# Patient Record
Sex: Female | Born: 1990 | Race: Black or African American | Hispanic: No | Marital: Single | Smoking: Former smoker
Health system: Southern US, Community
[De-identification: ages and names within clinical notes are randomized; demographics above are authoritative.]

## PROBLEM LIST (undated history)

## (undated) DIAGNOSIS — A749 Chlamydial infection, unspecified: Secondary | ICD-10-CM

## (undated) DIAGNOSIS — Z8742 Personal history of other diseases of the female genital tract: Secondary | ICD-10-CM

## (undated) DIAGNOSIS — A64 Unspecified sexually transmitted disease: Secondary | ICD-10-CM

## (undated) DIAGNOSIS — G43909 Migraine, unspecified, not intractable, without status migrainosus: Secondary | ICD-10-CM

## (undated) DIAGNOSIS — A549 Gonococcal infection, unspecified: Secondary | ICD-10-CM

## (undated) HISTORY — PX: BREAST ENHANCEMENT SURGERY: SHX7

---
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
## 2010-10-26 LAB — BASIC METABOLIC PANEL
BUN: 6 mg/dL (ref 6–23)
BUN: 6 mg/dL (ref 6–23)
BUN: 7 mg/dL (ref 6–23)
CO2: 27 mEq/L (ref 19–32)
Calcium: 8.1 mg/dL — ABNORMAL LOW (ref 8.4–10.5)
Calcium: 8.7 mg/dL (ref 8.4–10.5)
Calcium: 8.8 mg/dL (ref 8.4–10.5)
Chloride: 103 mEq/L (ref 96–112)
Chloride: 105 mEq/L (ref 96–112)
Chloride: 105 mEq/L (ref 96–112)
Chloride: 106 mEq/L (ref 96–112)
Creatinine, Ser: 0.59 mg/dL (ref 0.4–1.2)
Creatinine, Ser: 0.65 mg/dL (ref 0.4–1.2)
Creatinine, Ser: 0.76 mg/dL (ref 0.4–1.2)
GFR calc Af Amer: >60 mL/min (ref >60)
GFR calc non Af Amer: >60 mL/min (ref >60)
GFR calc non Af Amer: >60 mL/min (ref >60)
GFR calc non Af Amer: >60 mL/min (ref >60)
GFR calc non Af Amer: >60 mL/min (ref >60)
Glucose, Bld: 129 mg/dL — ABNORMAL HIGH (ref 70–99)
Glucose, Bld: 99 mg/dL (ref 70–99)
Potassium: 3.5 mEq/L (ref 3.5–5.1)
Potassium: 3.7 mEq/L (ref 3.5–5.1)
Potassium: 3.7 mEq/L (ref 3.5–5.1)
Sodium: 139 mEq/L (ref 135–145)
Sodium: 139 mEq/L (ref 135–145)
Sodium: 140 mEq/L (ref 135–145)

## 2010-10-26 LAB — BLOOD GAS, ARTERIAL
Acid-Base Excess: 3.9 mmol/L — ABNORMAL HIGH (ref 0.0–2.0)
Bicarbonate: 27.5 mEq/L — ABNORMAL HIGH (ref 20.0–24.0)
Bicarbonate: 27.7 mEq/L — ABNORMAL HIGH (ref 20.0–24.0)
FIO2: 0.21 %
O2 Saturation: 98.7 %
Patient temperature: 98.6
TCO2: 24.7 mmol/L (ref 0–100)
pCO2 arterial: 39.1 mmHg (ref 35.0–45.0)
pCO2 arterial: 39.8 mmHg (ref 35.0–45.0)
pH, Arterial: 7.456 — ABNORMAL HIGH (ref 7.350–7.400)
pO2, Arterial: 113 mmHg — ABNORMAL HIGH (ref 80.0–100.0)

## 2010-10-26 LAB — URINALYSIS, ROUTINE W REFLEX MICROSCOPIC
Bilirubin Urine: NEGATIVE
Glucose, UA: NEGATIVE mg/dL
Hgb urine dipstick: NEGATIVE
Ketones, ur: NEGATIVE mg/dL
Nitrite: NEGATIVE
Nitrite: NEGATIVE
Protein, ur: NEGATIVE mg/dL
Specific Gravity, Urine: 1.012 (ref 1.005–1.030)
Specific Gravity, Urine: 1.021 (ref 1.005–1.030)
Urobilinogen, UA: 0.2 mg/dL (ref 0.0–1.0)
Urobilinogen, UA: 1 mg/dL (ref 0.0–1.0)
pH: 7 (ref 5.0–8.0)
pH: 8.5 — ABNORMAL HIGH (ref 5.0–8.0)

## 2010-10-26 LAB — BLOOD GAS, VENOUS
Acid-Base Excess: 1.3 mmol/L (ref 0.0–2.0)
Bicarbonate: 27.1 mEq/L — ABNORMAL HIGH (ref 20.0–24.0)
Drawn by: 29459
FIO2: 0.21 %
O2 Saturation: 66.1 %
Patient temperature: 98.6
TCO2: 25 mmol/L (ref 0–100)
pCO2, Ven: 51 mmHg — ABNORMAL HIGH (ref 45.0–50.0)
pH, Ven: 7.345 — ABNORMAL HIGH (ref 7.250–7.300)
pO2, Ven: 38.2 mmHg (ref 30.0–45.0)

## 2010-10-26 LAB — COMPREHENSIVE METABOLIC PANEL
ALT: 11 U/L (ref 0–35)
AST: 20 U/L (ref 0–37)
Albumin: 3.8 g/dL (ref 3.5–5.2)
Alkaline Phosphatase: 55 U/L (ref 39–117)
BUN: 12 mg/dL (ref 6–23)
CO2: 26 mEq/L (ref 19–32)
Calcium: 9.3 mg/dL (ref 8.4–10.5)
Chloride: 108 mEq/L (ref 96–112)
Creatinine, Ser: 0.63 mg/dL (ref 0.4–1.2)
GFR calc Af Amer: >60 mL/min (ref >60)
GFR calc non Af Amer: >60 mL/min (ref >60)
Glucose, Bld: 78 mg/dL (ref 70–99)
Potassium: 4 mEq/L (ref 3.5–5.1)
Sodium: 139 mEq/L (ref 135–145)
Total Bilirubin: 0.4 mg/dL (ref 0.3–1.2)
Total Protein: 6.6 g/dL (ref 6.0–8.3)

## 2010-10-26 LAB — SALICYLATE LEVEL
Salicylate Lvl: 15.6 mg/dL (ref 2.8–20.0)
Salicylate Lvl: 27.9 mg/dL — ABNORMAL HIGH (ref 2.8–20.0)
Salicylate Lvl: 6.6 mg/dL (ref 2.8–20.0)

## 2010-10-26 LAB — RAPID URINE DRUG SCREEN, HOSP PERFORMED
Amphetamines: NOT DETECTED
Barbiturates: NOT DETECTED
Benzodiazepines: NOT DETECTED
Cocaine: NOT DETECTED
Opiates: NOT DETECTED
Tetrahydrocannabinol: NOT DETECTED

## 2010-10-26 LAB — URINALYSIS, DIPSTICK ONLY
Bilirubin Urine: NEGATIVE
Bilirubin Urine: NEGATIVE
Glucose, UA: NEGATIVE mg/dL
Glucose, UA: NEGATIVE mg/dL
Hgb urine dipstick: NEGATIVE
Ketones, ur: NEGATIVE mg/dL
Ketones, ur: NEGATIVE mg/dL
Leukocytes, UA: NEGATIVE
Nitrite: NEGATIVE
Protein, ur: NEGATIVE mg/dL
Specific Gravity, Urine: 1.013 (ref 1.005–1.030)
pH: 8 (ref 5.0–8.0)
pH: 8 (ref 5.0–8.0)

## 2010-10-26 LAB — DIFFERENTIAL
Basophils Absolute: 0 10*3/uL (ref 0.0–0.1)
Basophils Relative: 0 % (ref 0–1)
Eosinophils Absolute: 0.3 10*3/uL (ref 0.0–0.7)
Eosinophils Relative: 3 % (ref 0–5)
Lymphocytes Relative: 24 % (ref 12–46)
Lymphs Abs: 2.1 10*3/uL (ref 0.7–4.0)
Monocytes Absolute: 0.7 10*3/uL (ref 0.1–1.0)
Monocytes Relative: 8 % (ref 3–12)
Neutro Abs: 5.7 10*3/uL (ref 1.7–7.7)
Neutrophils Relative %: 64 % (ref 43–77)

## 2010-10-26 LAB — MAGNESIUM: Magnesium: 2 mg/dL (ref 1.5–2.5)

## 2010-10-26 LAB — CBC
HCT: 38 % (ref 36.0–46.0)
Hemoglobin: 13.1 g/dL (ref 12.0–15.0)
MCHC: 34.6 g/dL (ref 30.0–36.0)
MCV: 88.7 fL (ref 78.0–100.0)
Platelets: 234 10*3/uL (ref 150–400)
RBC: 4.28 MIL/uL (ref 3.87–5.11)
RDW: 13.6 % (ref 11.5–15.5)
WBC: 8.9 10*3/uL (ref 4.0–10.5)

## 2010-10-26 LAB — PROTIME-INR
INR: 1.1 (ref 0.00–1.49)
Prothrombin Time: 15 s (ref 11.6–15.2)

## 2010-10-26 LAB — URINE MICROSCOPIC-ADD ON

## 2010-10-26 LAB — ETHANOL: Alcohol, Ethyl (B): <5 mg/dL (ref 0–10)

## 2010-10-26 LAB — PREGNANCY, URINE: Preg Test, Ur: NEGATIVE

## 2010-10-26 LAB — ACETAMINOPHEN LEVEL: Acetaminophen (Tylenol), Serum: <10 ug/mL — ABNORMAL LOW (ref 10–30)

## 2010-10-26 LAB — TSH: TSH: 1.214 u[IU]/mL (ref 0.350–4.500)

## 2010-11-30 NOTE — H&P (Signed)
Kristen Hensley, Kristen Hensley                 ACCOUNT NO.:  [id number]   MEDICAL RECORD NO.:  [id number]          PATIENT TYPE:  EMS   LOCATION:  ED                           FACILITY:  [facility]   PHYSICIAN:  Pedro Earls, MD     DATE OF BIRTH:  1990/10/23   DATE OF ADMISSION:  11/22/2008  DATE OF DISCHARGE:                              HISTORY & PHYSICAL   PRIMARY CARE PHYSICIAN:  Unassigned.   CHIEF COMPLAINT:  Aspirin overdose.   HISTORY OF PRESENT ILLNESS:  This is an 20 year old African-American  female patient with no significant past medical history was brought in  after an aspirin overdose. According to the patient, she had some  argument with her boyfriend today and after that, she took a handful of  aspirin, which she thought was definitely less than 50. The patient  subsequently had called her family members to notify that she had taken  some aspirin and had started feeling shortness of breath and was having  difficulty breathing.  The patient was brought in to the ER for further  evaluation and management.   REVIEW OF SYSTEMS:  As above.  Rest of the review of systems negative.   PAST MEDICAL HISTORY:  None.   PAST SURGICAL HISTORY:  None.   MEDICATIONS:  None.   SOCIAL HISTORY:  Negative smoking, alcohol, IV drug abuse.   FAMILY HISTORY:  Negative for diabetes, heart disease.   ALLERGIES:  NO KNOWN DRUG ALLERGIES.   PHYSICAL EXAMINATION:  VITAL SIGNS:  Temperature 97.6, respirations 18,  pulse 90's to 100's, blood pressure 80s to 106 over 50s to 70s, pulse ox  96% on 2 liters nasal cannula.  GENERAL:  Patient awake, alert, oriented x3.  Does not appear to be in  distress.  HEENT:  Pupils equal, round. No pallor. Extraocular muscles intact.  Mucous membranes are dry.  NECK:  Supple. No JVD. No lymphadenopathy.  CARDIOVASCULAR:  S1 and S2 regular. There is sinus tachycardia.  CHEST:  Clear.  ABDOMEN:  Soft. Non-tender. Bowel sounds present. No hepatosplenomegaly.  EXTREMITIES:  No clubbing, cyanosis, edema.  NEUROLOGIC:  Sensory grossly intact. Cranial nerves 2-12 are intact.  SKIN:  No rashes.  MUSCULOSKELETAL:  Unremarkable.   LABORATORY:  Initial salicylate levels were 15.6, repeated one was 27.9,  magnesium was 2.0, alcohol was less than 5, Tylenol level was less than  10, UA was negative. Urine pregnancy test was negative. Basic metabolic  panel unremarkable. INR 1.1. CBC unremarkable. Venous gases were done  with a pH of 7.34, pCO2 of 51, pO2 38, bicarb 27.1.   IMPRESSION:  1. Salicylate poisoning.  2. Dehydration.  3. Hypotension.  4. Sinus tachycardia.   PLAN:  Admit to ICU.  Continue D5 half normal with potassium and add  bicarb at 150 an hour. BMP q.4 h, aspirin levels q.4 h. Check ABG now  and in the a.m.Pedro Earls, MD  Electronically Signed     NS/MEDQ  D:  11/22/2008  T:  11/22/2008  Job:  [id number]

## 2010-11-30 NOTE — Discharge Summary (Signed)
Kristen Hensley, Kristen Hensley                 ACCOUNT NO.:  [id number]   MEDICAL RECORD NO.:  [id number]          PATIENT TYPE:  INP   LOCATION:  1520                         FACILITY:  [facility]   PHYSICIAN:  Isidor Holts, M.D.  DATE OF BIRTH:  04/27/1991   DATE OF ADMISSION:  11/22/2008  DATE OF DISCHARGE:  11/24/2008                               DISCHARGE SUMMARY   PRIMARY CARE PHYSICIAN:  Unassigned.   DISCHARGE DIAGNOSES:  1. Aspirin overdose.  2. Dehydration/volume depletion, secondary to #1.  3. Hypotension, secondary to #1 and #2 above.  4. Depression.   DISCHARGE MEDICATIONS:  None.   PROCEDURE:  None.   CONSULTATIONS:  Dr. Antonietta Breach, psychiatry.   ADMISSION HISTORY:  As in H&P notes of Nov 22, 2008, dictated by Dr.  Pedro Earls.  However, in brief this is an 20 year old female, with  no significant past medical or surgical history, who was brought to the  emergency department following ingestion of a handful of Aspirin  tablets, after an argument with her friend.  Reportedly, she started  feeling short of breath, and called her family to notify that she had  ingested several Aspirins.  On initial evaluation in the emergency  department she was found to have a salicylate level of 15.6 which was  then repeated and was found to be 27.9.  She was admitted to the  intensive care unit for further evaluation and management.   CLINICAL COURSE:  1. Salicylate overdose.  Specific amount of salicylate ingested, was      unclear.  However, this was a handful and probably close to 50      tablets.  The patient's acetaminophen levels were less than 10.      Alcohol level less than 5. There was no history of associated other      substance ingestion.  ABGs were done, which showed the following      findings on room air.  PH 7.456, pCO2 39.8, pO2 94.7, bicarbonate      27.7 on venous blood.  Urine drug screen was negative, as was      pregnancy test.  [facility]  was contacted and      they were very helpful in directing appropriate management.  The      patient was placed on intravenous infusion of bicarbonate, and      proton pump inhibitor administered.  Urine output was monitored and      serial salicylate levels, urine pH and serum electrolytes were      done, over the course of her hospitalization.  Salicylate level      reached an all-time high of 36.5 and then gradually drifted down,      normalizing at less than 4 on Nov 24, 2008.  The patient's clinical      condition remained stable.  She experienced no abdominal pain,      vomiting or hematemesis and no metabolic acidosis was documented.      She did appear dehydrated at the time of presentation and had a  blood pressure initially as low as 86/43, but this responded to      intravenous fluid hydration with appropriate boluses.  By Nov 23, 2008 in the p.m., the poison center called and after careful review      of the patient's biochemistries, cleared the patient from the      viewpoint of toxicology.  The patient was then transferred out of      intensive care unit, remained stable and we were able to      discontinue intravenous fluids on Nov 24, 2008 a.m. without any      deleterious consequences.   1. Depression.  The patient clinically appeared somewhat depressed,      following her overdose.  Psychiatric consultation has been      requested and was kindly provided by Dr. Antonietta Breach. For      details of the consultation, refer to consultation notes of Nov 24, 2008.  He has opined that major depressive disorder ought to be      considered, and has recommended transfer to inpatient psychiatric      unit for further evaluation and treatment.   DISPOSITION:  The patient was on Nov 24, 2008, considered medically  stable, drug overdose had resolved, and there were no further issues.  She was therefore medically cleared for discharge to [facility]  unit for  continued inpatient psychiatric care.   DIET:  No restrictions.   ACTIVITY:  Activity as tolerated.   FOLLOW-UP INSTRUCTIONS:  This will be determined, following discharge  from the [facility].      Isidor Holts, M.D.  Electronically Signed     CO/MEDQ  D:  11/24/2008  T:  11/24/2008  Job:  [id number]

## 2010-11-30 NOTE — H&P (Signed)
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 Jeanie Sewer for inpatient adolescent  psychiatric treatment of suicide attempt, depression and dangerous  disruptive behavior with partial amnesia.  The patient informed Dr.  Jeanie Sewer of a 2 week course of depression preceding her overdose with  aspirin informing him that it was definitely a suicide attempt.  She had  given different accounts to the emergency department, medical service  and mother.  Mother is angry that the patient is required to have mental  health treatment for her overdose which could have been lethal requiring  EMS transport and critical care treatment.  The patient and mother  subsequently conclude that the patient just accidentally took extra  aspirin when she had a headache without expecting such untoward  reaction.   HISTORY OF PRESENT ILLNESS:  The patient states mother was in  [place] visiting others when she called mother that she was having  difficulty breathing and her mind was coming in and out after taking  aspirin.  Mother contacted a friend who went to the patient's residence  to help the patient try to vomit as recommended by mother.  The patient  was apparently having chest pain, dyspnea, her memory and attention were  coming and going.  In the emergency department, assessment attempted to  coordinate the understanding of the family, the patient and  medical  tests to clarify the risks to the patient.  The patient had reportedly  ingested aspirin from a bottle of 200 count that the family concluded  could have had no more than 158 tablets left at the time of the  overdose.  The patient was estimated to have taken between 50-150  tablets with the patient just describing it as aspirin poured into her  hand.  The patient had taken the pills apparently after attempting to  deal with the best female friend who was threatening to drop out of  school.  The patient suggests she had gone to exercise at the gym in  preparing for a trip to the beach after school is over.  The patient did  not describe other associations with headache, other overdose or  psychological symptoms that would definitely clarify the extent of the  patient's decompensation.  The patient informed Dr. Jeanie Sewer that she  had 2 weeks of anhedonia, dysphoria, diminished sleep, withdrawing from  mother staying in bed in the morning, and diminished energy.  Dr.  Jeanie Sewer interpreted the depression possibly associated with medical  condition or possibly major depression needing inpatient mental health  treatment.  The patient had informed Dr. Jeanie Sewer  that she was  definitely attempting suicide at the time.  The patient later  acknowledged that she had some patchy memory loss and poor attention  span, but that she and mother had reformulated that the patient had  taken the aspirin accidentally possibly taking some and then more  without remembering she had already taken some.  The patient's mother  then formulated that the patient has had no depression and that she  handles stress well.  They report that she takes care of what she can  take care of and then moves on.  The patient is living between the  households of mother and father having 2 younger siblings.  She will be  leaving the family for college either at [employer] or [employer].  She stays  primarily with father, but does see mother on the  weekends.  The patient is on no medications other than having had  Humibid, Protonix, and Senna in the Critical Care Unit.  The patient is  not certain of the full extent of what has happened to her, but mother  can thank God that the patient is alive and the patient can state that  she wants to live now.  The patient does not acknowledge manic or  psychotic symptoms.  However, the patient still cannot fully assimilate  cause and affect for taking the pills, the medical and psychological  consequences, and how to resolve the risk factors for more serious  injury.   PAST MEDICAL HISTORY:  The patient has apparently attended the emergency  department for 4 visits between May 2000 and August 2009 other than the  emergency for the overdose immediately preceding her [facility]  hospitalization.  The patient has had mittelschmerz, earache, and flu  symptoms in the past.  She has a thin habitus, but denies other eating  disorder symptoms.  She is not requiring Senna, Protonix or Humibid  after release from the Critical Care Unit where she was treated with  intravenous hydration and close monitoring.  She has eyeglasses for  reading.  She has no medication allergies.  She has no other regular  medications.  She reports that menses are normal and she has no concern  about pregnancy, though she does not discuss her sexual activity.  Her  urine pregnancy test had been negative in the emergency department as  had her urine drug screen and she does not acknowledge any substance  use.  Her aspirin level had reached 36.5 maximum as she was being  treated with reference range 2.8-20.  She has had no seizure or syncope.  She has had no heart murmur or arrhythmia.  She has had no purging.  Potassium reached a low 3.4 in the course of her medical treatment with  the patient describing spontaneous hyperventilation, patchy memory and  attention span.    REVIEW OF SYSTEMS:  The patient denies difficulty with gait, gaze or  continence subsequently.  She denies rash, jaundice or purpura.  She has  no known exposure to communicable disease or toxins.  She has no cough,  congestion, dyspnea or wheeze currently.  She has no chest pain,  palpitations or presyncope subsequently.  She has no abdominal pain,  nausea, vomiting or diarrhea.  There is no dysuria or arthralgia.   IMMUNIZATIONS:  Up to date.   FAMILY HISTORY:  The patient resides with father primarily having 2  younger siblings, a brother and a sister all of whom go between the 2  households.  They primarily stay with mother on some weekends.  Some  family member mentions a distant relative with intravenous drug abuse.  Mother does not want the patient to get help because of stigma.  There  is a family history of diabetes, hypertension and cancer according to  the patient.  Parents are divorced.   SOCIAL/DEVELOPMENTAL HISTORY:  The patient is a Holiday representative at [employer].  She indicates she will graduate and is deciding between [address] and [employer] after high school.  She denies any  legal charges.  She does not definitely acknowledge any sexual activity.  She denies use of alcohol or illicit drugs.   ASSETS:  The patient seems social.   MENTAL STATUS EXAM:  Height is 157 cm and weight is 47 kg having been  47.7 kg in the ED January 27, 2006.  Blood pressure is 94/67 with heart  rate of 111.  She is right handed.  She is alert and oriented with  speech intact.  Cranial nerves II-XII are intact.  Muscle strengths and  tone are normal.  There are no pathologic reflexes or soft neurologic  findings except the patient still has some inability to define or  resolve the patchy memory and attention span surrounding her call for  help and the hospital course of treatment where she has given various  conflicting histories.  She has no abnormal involuntary movements.   Gait  and gaze are intact.  The patient is gradually clearing for restoration  of social and emotional problem solving.  She attempts to rework her  overdose now preferring to account for such as accidental or  irresponsible management of the headache rather than suicide attempt for  depression or acute psychosocial stressors.  The patient has hysteroid  denial and displacement features.  She appears to have some peer  relational conflictual concerns, individuation stressors and divorced  family stressors of which she and mother subsequently have denial of any  significance.  She has no suicidal ideation now, but has not resolved  her serious overdose.  She has no mania or psychosis.  She has no  anxiety or definite ongoing dissociation.  Organicity would likely have  been in the form of delirium associated with substance intoxication of  aspirin now resolving.  She is not homicidal or assaultive   IMPRESSION:  AXIS I:  1. Depressive disorder not otherwise specified with atypical features.  2. Aspirin intoxication delirium, now remitting.  3. Relational problem, not otherwise specified.  4. Parent/child relational problem.  5. Phase of life problem.  AXIS II:  No diagnosis.  AXIS III:  1. Aspirin overdose.  2. Eyeglasses for reading.  3. Relative small and thin stature.  AXIS IV:  Stressors, phase of life moderate acute and chronic; peer  relations moderate acute and chronic; family moderate acute and chronic.  AXIS V:  Global assessment of functioning on admission 30 with highest  in the last year estimated at 84.   PLAN:  The patient is admitted for inpatient adolescent psychiatric and  multidisciplinary multimodal behavioral health treatment in a team-based  programmatic locked psychiatric unit.  We will resolve any delirium and  atypical depression for return to outpatient management of such  conflicts and stressors.  Cognitive behavioral therapy, anger  management,  interpersonal therapy, social and communication skill  training, problem-solving and coping skill training, family  intervention, individuation separation and identity consolidation  therapies can be undertaken.  Estimated length stay  is 3 days with  target symptom for discharge being stabilization of any suicide risk and  mood, stabilization of memory and attention for safe and effective  behavioral restoration, and generalization of the capacity for safe  effective participation in outpatient treatment.      Lalla Brothers, MD  Electronically Signed     GEJ/MEDQ  D:  11/26/2008  T:  11/26/2008  Job:  [id number]

## 2010-11-30 NOTE — Consult Note (Signed)
Kristen Hensley                 ACCOUNT NO.:  [id number]   MEDICAL RECORD NO.:  [id number]          PATIENT TYPE:  INP   LOCATION:  1520                         FACILITY:  [facility]   PHYSICIAN:  Antonietta Breach, M.D.  DATE OF BIRTH:  05-15-1991   DATE OF CONSULTATION:  11/24/2008  DATE OF DISCHARGE:                                 CONSULTATION   REQUESTING PHYSICIAN:  Isidor Holts of [facility].   REASON FOR CONSULTATION:  Suicide attempt with overdose.   HISTORY OF PRESENT ILLNESS:  [name] is an 20 year old female  admitted to the [facility] on Nov 22, 2008 after an intentional  aspirin overdose.  She took an handful of aspirin.  She states that she  did it in a suicide attempt.   She does describe at least 2 weeks of depressed mood, poor energy, not  wanting to get out of bed, anhedonia, insomnia and she states that this  clearly was a suicide attempt.   She is not having any hallucinations or delusions.  She is oriented to  all spheres.  Her memory function is intact.   Precipitating stress involves a riff with a very close friend.   However, the disagreement with her close friend occurred late in the 2-  week period of depression symptoms.   PAST PSYCHIATRIC HISTORY:  No prior history of major depression.  No  suicide attempts in the past.   No history of elevated energy, decreased need for sleep or elevated  mood.   No history of eating disorder symptoms or panic attacks.   No history of mental treatment.   FAMILY PSYCHIATRIC HISTORY:  None known.   SOCIAL HISTORY:  Parents divorced.  The patient lives with her father.  The patient denies alcohol or illegal drugs.  [name] has no  children.  She is a Holiday representative at [employer].   PAST MEDICAL HISTORY:  Status post aspirin overdose.   MEDICATIONS:  MAR reviewed.  She is not currently on any psychotropic  medication.   ALLERGIES:  She has NO KNOWN DRUG ALLERGIES.   LABORATORY DATA:  Aspirin is currently negative.  Sodium 136, BUN 7,  creatinine 0.76, glucose 99.  HCG on the 8th was negative.  Urine drug  screen on the 8th was negative. Alcohol on the 8th was negative.  Tylenol on the 8th was negative.   REVIEW OF SYSTEMS:  Constitutional:  head, eyes, ears, nose and throat,  mouth, neurologic, psychiatric, cardiovascular, respiratory,  gastrointestinal, genitourinary, skin, musculoskeletal, hematologic,  lymphatic, endocrine and metabolic all unremarkable.   PHYSICAL EXAMINATION:  VITAL SIGNS:  Temperature 98.4, pulse 66,  respiratory rate 16, blood pressure 92/60.  GENERAL APPEARANCE:  [name] is a young female appearing her  chronologic age lying in a left lateral decubitus position in her  hospital bed with no abnormal involuntary movements.   MENTAL STATUS EXAM:  [name] is alert.  Her eye contact is good.  Her  attention span is slightly decreased. Concentration mildly decreased.  Affect is constricted.  Mood is depressed.  She  is oriented to all  spheres.  Her memory is intact to immediate, recent and remote.  Fund of  knowledge and intelligence are normal.  Speech involves normal rate and  prosody without dysarthria.  The volume of speech is decreased.  Thought  process is logical, coherent, goal-directed.  No looseness of  associations.  Thought content: She does acknowledge suicidal intent.  Insight is partial for her depression.  Judgment is impaired.   ASSESSMENT:  AXIS I:  293.83 mood disorder not otherwise specified,  depressed, rule out major depressive disorder.  AXIS II:  Deferred.  AXIS III:  See past medical history.  AXIS IV:  Primary support group.  AXIS V:  30.   [name] is at risk for suicide.  She does have symptoms of major  depression which preceded her social precipitating stress.   RECOMMENDATIONS:  1. Would admit to an inpatient psychiatric unit for further evaluation      and treatment.  2. Would  continue ego supportive psychotherapy. The undersigned      provided ego supportive psychotherapy and education.  3. For anti-insomnia would utilize Vistaril 25-50 mg p.o. q.h.s.      p.r.n. insomnia with caution regarding dry mouth and excessive      sedation.   Other psychotropic medication deferred.      Antonietta Breach, M.D.  Electronically Signed     JW/MEDQ  D:  11/24/2008  T:  11/24/2008  Job:  [id number]

## 2010-12-03 NOTE — Discharge Summary (Signed)
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  Patient had given variable descriptions of the reason and  mechanism for her aspirin overdose from the time of her arrival through  the course of medical stabilization and psychiatric consultation by Dr.  Jeanie Sewer.  The family was opposed to the patient receiving further  mental health care fearing stigma.  The patient was more capable of  accomplishing such but still recovered limited specific memory to  clarify differing providers documented, differing reports from the  patient about reason for overdose and how significant and extensive the  overdose might be.  For full details, please see the typed admission  assessment.   SYNOPSIS OF PRESENT ILLNESS:  The patient had ingested some portion of a  200 count bottle of aspirin while on the phone with a female friend with  whom she was having significant emotional and behavioral difficulty.  Estimates were that the patient ingested between 50 and 150 tablets,  contacting mother by phone who was in [place] who sent a friend of  hers to the home to help the patient.  The patient apparently could not  vomit by ingesting milk, and an ambulance was called as the patient  began to have waxing and waning attention and level  of consciousness as  well as chest pain and dyspnea.  Patient suggests that she had returned  from the gym where she was working out to get ready for [employer].  She  gave various accounts of having been depressed for the preceding 2 weeks  and overdosing to commit suicide as opposed to accidentally taking the  pills without counting them as she was treating a headache while in an  argument with friend on the phone who wanted to quit school.  The  patient did receive medical stabilization and was transferred for  psychiatric care.  Patient acknowledges that the family has issues with  parents divorced and the children living between both houses,  predominantly with father during the week and mother on the weekends.  The patient seems concerned but will not discuss her concern for the 2  younger siblings who will continue this pattern after the patient leaves  for college at [employer] or [employer] next fall.  There is family history  of diabetes, hypertension and cancer.   INITIAL MENTAL STATUS EXAMINATION:  The patient is right-handed with  intact neurological exam other than the patchy memory for  the various  statements she had made about her overdose.  Patient would smile but  would not complete the appropriate provision of history without full  social recognition of her lapses even though she is intelligent and  makes good grades.  Patient was gradually clearing in order to face a  goal of social and emotional problem solving and restoration.  She  varied between needing to account for suicidal or accidental overdose.  She had no dissociation or post-traumatic flashbacks.  She had no other  significant anxiety.  Her dysphoria was episodic, possibly present up to  2 weeks but otherwise possibly present only a couple of days.   LABORATORY FINDINGS:  In [facility], CBC was normal with white  count 8900, hemoglobin 13.1, MCV of 88.7 and platelet count 234,000.  Her pro time was  normal at 15 and INR at 1.1.  Venous pH was elevated at  7.345 with upper limit of normal 7.3.  Comprehensive metabolic panel was  normal with sodium 139, potassium 4, random glucose 78, creatinine 0.63,  calcium 9.3, albumin 3.8, AST 20 and ALT 11.  Blood alcohol was  negative, and urine drug screen was negative.  Acetaminophen level was  negative.  Initial aspirin level was 15.6 rising to 27.9 reaching a peak  of 36.5 with therapeutic range being 2.8 to 20 mg/dL.  Subsequently, the  aspirin level began to decline to 23.7, 16.1, 6.6 and finally less than  4.  Urine pregnancy test was negative.  Urinalysis was normal with  specific gravity of 1.021 and pH 7.  Magnesium was normal at 2 initially  and when repeated the following day at 1.9 with reference range of 1.5  to 2.5.  Urine dipstick documented that urine was alkalinized to at  least a pH of 8 and arterial blood gas to pH 7.461.  Electrolytes  remained otherwise intact throughout the medical treatment course at  [facility].  After transfer, there was an RPR that was nonreactive,  free T4 of 0.98 and TSH at 1.214, all normal.   HOSPITAL COURSE AND TREATMENT:  General medical exam by Jorje Guild, PA-C,  noted no medication allergies.  Patient is somewhat small in stature and  thin.  She had menarche at age 58 with regular menses, last being end of  April 2010.  Her BMI was 19.1.  She does have eyeglasses and  acknowledges being sexually active.  She was educated at length on  health maintenance and prevention.  Her overdose consequences were  resolved, and she was medically cleared for psychiatric treatment.  Her  vital signs were normal throughout hospital stay with temperature  maximum 98.1.  Her height was 157 cm, and weight was 47.2 kg initially,  dropping to 47 kg.  Initial supine blood pressure was 105/60 with heart  rate of 76 and standing blood pressure 98/65 with heart rate of 117 on  admission.  At the time of discharge,  supine blood pressure was 93/52  with heart rate of 65 and standing blood pressure 91/57 with heart rate  of 84.  Patient became progressively capable of participating verbally  in the treatment program and process.  However, at the same time, she  seemed to consolidate the family perspective that she had accidentally  overdosed trying to treat a headache and that all of her stressors are  resolved.  Patient acknowledged that she came close to dying but did  address coping skills and ways to prevent such in  the future.  Patient  did improve her verbal communication for problem solving and her  clarification of issues to address with family.  The family was not  interested in treatment.  In the discharge family session, parents were  only concerned that being in the hospital might affect the patient's  future plans of wanting to be a pharmacist, and the parents had no  concern that the patient could have died noting that she really did not  want to die and that she took the pills by mistake in their opinion.  Patient indicated she can be immature and had learned things about  herself that she can work on including communicating more effectively.  She indicated she could start expressing her feelings to her parents  instead of storing up such negative emotions.  She concluded she just  was not paying attention.  In the end, it appeared that patient had  become partially delirious in the course of her aspirin intoxication and  that she resolved all of the confusing statements about being depressed  and attempting suicide by the time of her complete clearing of the  aspirin overdose.  She required no seclusion or restraint during the  hospital stay and received no other medications.   FINAL DIAGNOSES:  AXIS I:  1.  Aspirin intoxication delirium--remitted.  1. Phase of life problem.  2. Parent-child relational problem.  3. Relational problem, not otherwise specified.  AXIS II:  No  diagnosis.  AXIS III:  1.  Aspirin overdose.  1. Eyeglasses for reading.  2. Relative small and thin stature.  AXIS IV:  Stressors:  Phase of life moderate, acute and chronic; peer  relations moderate, acute and chronic; family moderate, acute and  chronic.  AXIS V:  GAF on admission 30 with highest in the last year 34, and  discharge GAF was 53.   PLAN:  Patient was discharged to mother in improved condition, free of  suicide ideation and depression.  She follows a regular diet and has no  restrictions on physical activity.  She requires no pain management or  wound care.  Crisis and safety plans are outlined if needed.  Patient is  on no medications at the time of discharge.  She was seen  by Lennox Pippins, PhD, during the hospital stay and has psychiatric followup  with Dr. Lang Snow at [facility] on December 18, 2008, at 1500 hours at [phone].  Patient has outpatient psychotherapy intake at [facility] on Nov 30, 2008, at 1830 hours at [phone].      Lalla Brothers, MD  Electronically Signed     GEJ/MEDQ  D:  12/02/2008  T:  12/02/2008  Job:  [id number]   cc:   [facility]   Fax:  [phone] [facility]

## 2012-10-28 ENCOUNTER — Encounter (HOSPITAL_COMMUNITY): Payer: Self-pay | Admitting: *Deleted

## 2012-10-28 ENCOUNTER — Emergency Department (HOSPITAL_COMMUNITY)
Admission: EM | Admit: 2012-10-28 | Discharge: 2012-10-28 | Disposition: A | Discharge status: Home / Self Care | Payer: Self-pay | Attending: Emergency Medicine | Admitting: Emergency Medicine

## 2012-10-28 DIAGNOSIS — Z79899 Other long term (current) drug therapy: Secondary | ICD-10-CM | POA: Insufficient documentation

## 2012-10-28 DIAGNOSIS — J302 Other seasonal allergic rhinitis: Secondary | ICD-10-CM

## 2012-10-28 DIAGNOSIS — J309 Allergic rhinitis, unspecified: Secondary | ICD-10-CM | POA: Insufficient documentation

## 2012-10-28 MED ORDER — TRIAMCINOLONE ACETONIDE(NASAL) 55 MCG/ACT NA INHA: 2.0000 | Freq: Every day | NASAL | Status: DC

## 2012-10-28 NOTE — ED Notes (Signed)
Per pt report: pt c/o a really bad cough, dizziness, and periodic eye swelling.  Pt also c/o of generalized body aches.  Pt took benadryl and sudafed with no relief.

## 2012-10-28 NOTE — ED Provider Notes (Signed)
History     CSN: [id number]  Arrival date & time 10/28/12  7829   First MD Initiated Contact with Patient 10/28/12 0703      No chief complaint on file.   (Consider location/radiation/quality/duration/timing/severity/associated sxs/prior treatment) The history is provided by the patient.   patient here complaining of 24-hour history of thigh swelling and also nasal congestion consistent with her prior seasonal allergies. No fever or chills. Nonproductive cough noted. Has used Benadryl and Sudafed without relief. Denies any neck pain or photophobia. Symptoms have been persistent seemed to be worse with walking outside.  History reviewed. No pertinent past medical history.  History reviewed. No pertinent past surgical history.  No family history on file.  History  Substance Use Topics  . Smoking status: Never Smoker   . Smokeless tobacco: Not on file  . Alcohol Use: 1.0 oz/week    2 drink(s) per week    OB History   Grav Para Term Preterm Abortions TAB SAB Ect Mult Living                  Review of Systems  All other systems reviewed and are negative.    Allergies  Review of patient's allergies indicates no known allergies.  Home Medications   Current Outpatient Rx  Name  Route  Sig  Dispense  Refill  . diphenhydrAMINE (BENADRYL) 25 mg capsule   Oral   Take 25 mg by mouth every 6 (six) hours as needed for itching.         Marland Kitchen guaiFENesin (MUCINEX) 600 MG 12 hr tablet   Oral   Take 600 mg by mouth 2 (two) times daily.           BP 104/66  Pulse 99  Temp(Src) 98.2 F (36.8 C) (Oral)  Resp 18  Ht 5\' 2"  (1.575 m)  Wt 110 lb (49.896 kg)  BMI 20.11 kg/m2  SpO2 98%  LMP 09/27/2012  Physical Exam  Nursing note and vitals reviewed. Constitutional: She is oriented to person, place, and time. She appears well-developed and well-nourished.  Non-toxic appearance. No distress.  HENT:  Head: Normocephalic and atraumatic.  Pain to palpation  At patients  maxillary sinuses  Eyes: Conjunctivae, EOM and lids are normal. Pupils are equal, round, and reactive to light.  Neck: Normal range of motion. Neck supple. No tracheal deviation present. No mass present.  Cardiovascular: Normal rate, regular rhythm and normal heart sounds.  Exam reveals no gallop.   No murmur heard. Pulmonary/Chest: Effort normal and breath sounds normal. No stridor. No respiratory distress. She has no decreased breath sounds. She has no wheezes. She has no rhonchi. She has no rales.  Abdominal: Soft. Normal appearance and bowel sounds are normal. She exhibits no distension. There is no tenderness. There is no rebound and no CVA tenderness.  Musculoskeletal: Normal range of motion. She exhibits no edema and no tenderness.  Neurological: She is alert and oriented to person, place, and time. She has normal strength. No cranial nerve deficit or sensory deficit. GCS eye subscore is 4. GCS verbal subscore is 5. GCS motor subscore is 6.  Skin: Skin is warm and dry. No abrasion and no rash noted.  Psychiatric: She has a normal mood and affect. Her speech is normal and behavior is normal.    ED Course  Procedures (including critical care time)  Labs Reviewed - No data to display No results found.   No diagnosis found.    MDM  Patient to  be treated for seasonal allergies        Toy Baker, MD 10/28/12 [phone]

## 2013-03-20 ENCOUNTER — Encounter (HOSPITAL_COMMUNITY): Payer: Self-pay | Admitting: Emergency Medicine

## 2013-03-20 ENCOUNTER — Emergency Department (HOSPITAL_COMMUNITY)
Admission: EM | Admit: 2013-03-20 | Discharge: 2013-03-20 | Disposition: A | Discharge status: Home / Self Care | Payer: Self-pay | Attending: Emergency Medicine | Admitting: Emergency Medicine

## 2013-03-20 DIAGNOSIS — N760 Acute vaginitis: Secondary | ICD-10-CM | POA: Insufficient documentation

## 2013-03-20 DIAGNOSIS — B9689 Other specified bacterial agents as the cause of diseases classified elsewhere: Secondary | ICD-10-CM

## 2013-03-20 DIAGNOSIS — R21 Rash and other nonspecific skin eruption: Secondary | ICD-10-CM | POA: Insufficient documentation

## 2013-03-20 DIAGNOSIS — F17200 Nicotine dependence, unspecified, uncomplicated: Secondary | ICD-10-CM | POA: Insufficient documentation

## 2013-03-20 LAB — WET PREP, GENITAL: Yeast Wet Prep HPF POC: NONE SEEN

## 2013-03-20 MED ORDER — CLOTRIMAZOLE 1 % EX CREA: TOPICAL_CREAM | CUTANEOUS | Status: DC

## 2013-03-20 MED ORDER — METRONIDAZOLE 500 MG PO TABS: 500.0000 mg | ORAL_TABLET | Freq: Two times a day (BID) | ORAL | Status: DC

## 2013-03-20 NOTE — ED Provider Notes (Signed)
Medical screening examination/treatment/procedure(s) were performed by non-physician practitioner and as supervising physician I was immediately available for consultation/collaboration.  Toy Baker, MD 03/20/13 [phone]

## 2013-03-20 NOTE — ED Provider Notes (Signed)
CSN: [id number]     Arrival date & time 03/20/13  1424 History   First MD Initiated Contact with Patient 03/20/13 1500     Chief Complaint  Patient presents with  . Vaginal Pain   (Consider location/radiation/quality/duration/timing/severity/associated sxs/prior Treatment) HPI Comments: 22 year old healthy female presents to the emergency department complaining of vaginal pain x3 days. Patient states 3 days ago she noticed a small "hair bumps" around her vagina, progressively worsened and became gray with some pus. States it feels irritated, 3/10, has not tried any alleviating factors. Also admits to vaginal discharge for the past 3 days similar to her prior yeast infection. Patient states she's been on Depo-provera for the past 2 years, and every time she has vaginal spotting from her menstrual cycles she developed a yeast infection. Denies increased urinary frequency, urgency or dysuria, abdominal pain, nausea or vomiting, fever or chills. States she has not had sexual intercourse in the past 4 months. Denies history of STDs.   Patient is a 22 y.o. female presenting with vaginal pain. The history is provided by the patient.  Vaginal Pain Pertinent negatives include no abdominal pain, chills, fever, nausea or vomiting.    History reviewed. No pertinent past medical history. History reviewed. No pertinent past surgical history. No family history on file. History  Substance Use Topics  . Smoking status: Current Some Day Smoker    Types: Cigars  . Smokeless tobacco: Never Used  . Alcohol Use: 1.0 oz/week    2 drink(s) per week   OB History   Grav Para Term Preterm Abortions TAB SAB Ect Mult Living                 Review of Systems  Constitutional: Negative for fever, chills and activity change.  Gastrointestinal: Negative for nausea, vomiting and abdominal pain.  Genitourinary: Positive for vaginal discharge, genital sores and vaginal pain. Negative for dysuria, urgency, frequency,  flank pain and pelvic pain.  Musculoskeletal: Negative for back pain.  All other systems reviewed and are negative.    Allergies  Review of patient's allergies indicates no known allergies.  Home Medications   Current Outpatient Rx  Name  Route  Sig  Dispense  Refill  . medroxyPROGESTERone (DEPO-PROVERA) 150 MG/ML injection   Intramuscular   Inject 150 mg into the muscle every 3 (three) months. 12/2012          BP 105/52  Pulse 89  Temp(Src) 98.9 F (37.2 C)  Resp 20  SpO2 100%  LMP 03/20/2013 Physical Exam  Nursing note and vitals reviewed. Constitutional: She is oriented to person, place, and time. She appears well-developed and well-nourished. No distress.  HENT:  Head: Normocephalic and atraumatic.  Mouth/Throat: Oropharynx is clear and moist.  Eyes: Conjunctivae are normal.  Neck: Normal range of motion. Neck supple.  Cardiovascular: Normal rate, regular rhythm and normal heart sounds.   Pulmonary/Chest: Effort normal and breath sounds normal.  Abdominal: Soft. Bowel sounds are normal. She exhibits no distension and no mass. There is no tenderness. There is no rebound and no guarding.  Genitourinary: Uterus normal.    Cervix exhibits no motion tenderness, no discharge and no friability. Right adnexum displays no mass, no tenderness and no fullness. Left adnexum displays no mass, no tenderness and no fullness. No erythema, tenderness or bleeding around the vagina. Vaginal discharge (brown, malodorous) found.  Musculoskeletal: Normal range of motion. She exhibits no edema.  Neurological: She is alert and oriented to person, place, and time.  Skin:  Skin is warm and dry. She is not diaphoretic.  Psychiatric: She has a normal mood and affect. Her behavior is normal.    ED Course  Procedures (including critical care time) Labs Review Labs Reviewed  WET PREP, GENITAL - Abnormal; Notable for the following:    Clue Cells Wet Prep HPF POC FEW (*)    WBC, Wet Prep HPF  POC FEW (*)    All other components within normal limits  GC/CHLAMYDIA PROBE AMP   Imaging Review No results found.  MDM   1. BV (bacterial vaginosis)   2. Rash     Patient with bacterial vaginosis and fungal rash to her labia. No ulceration. No abdominal pain, vaginal pain, CMT, adnexal tenderness. Afebrile, in no apparent distress, normal vital signs. Discharged with clotrimazole cream for fungal infection of labia, Flagyl for BV. Return precautions discussed. Resource guide given for a PCP for followup. Patient states understanding of plan and is agreeable.    Trevor Mace, PA-C 03/20/13 1656

## 2013-03-20 NOTE — ED Notes (Addendum)
Pt reports 3/10 vaginal pain that started three days ago. Pt also reports irritation, however denies burning or itching. Pt states she had a vaginal rash that formed three days ago, however now it has formed blisters. Pt denies nausea or emesis. Pt reports irregular periods due to being on Depo. Pt is A/O x4 and in NAD.

## 2013-03-20 NOTE — Progress Notes (Signed)
[facility] CL provided pt with a list of primary care resources in [facility], highlighting [facility].

## 2013-03-20 NOTE — ED Notes (Signed)
PA at bedside with NT for pelvic exam.

## 2013-03-21 LAB — GC/CHLAMYDIA PROBE AMP: CT Probe RNA: NEGATIVE

## 2014-01-25 ENCOUNTER — Emergency Department (HOSPITAL_COMMUNITY)
Admission: EM | Admit: 2014-01-25 | Discharge: 2014-01-25 | Disposition: A | Discharge status: Home / Self Care | Payer: Self-pay | Attending: Emergency Medicine | Admitting: Emergency Medicine

## 2014-01-25 ENCOUNTER — Encounter (HOSPITAL_COMMUNITY): Payer: Self-pay | Admitting: Emergency Medicine

## 2014-01-25 DIAGNOSIS — F17200 Nicotine dependence, unspecified, uncomplicated: Secondary | ICD-10-CM | POA: Insufficient documentation

## 2014-01-25 DIAGNOSIS — Z202 Contact with and (suspected) exposure to infections with a predominantly sexual mode of transmission: Secondary | ICD-10-CM | POA: Insufficient documentation

## 2014-01-25 DIAGNOSIS — Z3202 Encounter for pregnancy test, result negative: Secondary | ICD-10-CM | POA: Insufficient documentation

## 2014-01-25 DIAGNOSIS — Z79899 Other long term (current) drug therapy: Secondary | ICD-10-CM | POA: Insufficient documentation

## 2014-01-25 LAB — WET PREP, GENITAL
Clue Cells Wet Prep HPF POC: NONE SEEN
Trich, Wet Prep: NONE SEEN
WBC, Wet Prep HPF POC: NONE SEEN
YEAST WET PREP: NONE SEEN

## 2014-01-25 LAB — PREGNANCY, URINE: PREG TEST UR: NEGATIVE

## 2014-01-25 MED ORDER — AZITHROMYCIN 1 G PO PACK
1.0000 g | PACK | Freq: Once | ORAL | Status: AC
  Administered 2014-01-25: 1 g via ORAL
  Filled 2014-01-25: qty 1

## 2014-01-25 NOTE — Discharge Instructions (Signed)
You have been treated for Chlamydia.  If you wan to know the results of your tests, you should com e to Medical Records Monday-Friday with a photo ID.

## 2014-01-25 NOTE — ED Provider Notes (Signed)
CSN: [id number]     Arrival date & time 01/25/14  1047 History   First MD Initiated Contact with Patient 01/25/14 1101     Chief Complaint  Patient presents with  . Exposure to STD     (Consider location/radiation/quality/duration/timing/severity/associated sxs/prior Treatment) HPI  History reviewed. No pertinent past medical history. History reviewed. No pertinent past surgical history. History reviewed. No pertinent family history. History  Substance Use Topics  . Smoking status: Current Some Day Smoker    Types: Cigars  . Smokeless tobacco: Never Used  . Alcohol Use: 1.0 oz/week    2 drink(s) per week   OB History   Grav Para Term Preterm Abortions TAB SAB Ect Mult Living                 Review of Systems  Constitutional: Negative for fever, chills, diaphoresis, appetite change and fatigue.  HENT: Negative for mouth sores, sore throat and trouble swallowing.   Eyes: Negative for visual disturbance.  Respiratory: Negative for cough, chest tightness, shortness of breath and wheezing.   Cardiovascular: Negative for chest pain.  Gastrointestinal: Negative for nausea, vomiting, abdominal pain, diarrhea and abdominal distention.  Endocrine: Negative for polydipsia, polyphagia and polyuria.  Genitourinary: Negative for dysuria, frequency and hematuria.  Musculoskeletal: Negative for gait problem.  Skin: Negative for color change, pallor and rash.  Neurological: Negative for dizziness, syncope, light-headedness and headaches.  Hematological: Does not bruise/bleed easily.  Psychiatric/Behavioral: Negative for behavioral problems and confusion.      Allergies  Review of patient's allergies indicates no known allergies.  Home Medications   Prior to Admission medications   Medication Sig Start Date End Date Taking? Authorizing Provider  Etonogestrel (NEXPLANON ) Inject 1 application into the skin once.   Yes Historical Provider, MD  Multiple Vitamin (MULTIVITAMIN WITH  MINERALS) TABS tablet Take 1 tablet by mouth daily.   Yes Historical Provider, MD   BP 117/60  Pulse 98  Temp(Src) 98.3 F (36.8 C) (Oral)  Resp 17  SpO2 100%  LMP 01/25/2014 Physical Exam  Constitutional: She is oriented to person, place, and time. She appears well-developed and well-nourished. No distress.  HENT:  Head: Normocephalic.  Eyes: Conjunctivae are normal. Pupils are equal, round, and reactive to light. No scleral icterus.  Neck: Normal range of motion. Neck supple. No thyromegaly present.  Cardiovascular: Normal rate and regular rhythm.  Exam reveals no gallop and no friction rub.   No murmur heard. Pulmonary/Chest: Effort normal and breath sounds normal. No respiratory distress. She has no wheezes. She has no rales.  Abdominal: Soft. Bowel sounds are normal. She exhibits no distension. There is no tenderness. There is no rebound.  Genitourinary:    Musculoskeletal: Normal range of motion.  Neurological: She is alert and oriented to person, place, and time.  Skin: Skin is warm and dry. No rash noted.  Psychiatric: She has a normal mood and affect. Her behavior is normal.    ED Course  Procedures (including critical care time) Labs Review Labs Reviewed  WET PREP, GENITAL  GC/CHLAMYDIA PROBE AMP  PREGNANCY, URINE    Imaging Review No results found.   EKG Interpretation None      MDM   Final diagnoses:  Exposure to STD    Negative wet prep .  Given by  mouth Zithromax. Discharged home.    Rolland PorterMark Akshaya Toepfer, MD 01/25/14 1322

## 2014-01-25 NOTE — ED Notes (Signed)
Pt's exboyfriend reached out to her and told her that she needed to be tested for chlamydia. No STD sx at this time.

## 2014-01-27 LAB — GC/CHLAMYDIA PROBE AMP
CT PROBE, AMP APTIMA: NEGATIVE
GC PROBE AMP APTIMA: NEGATIVE

## 2014-12-08 ENCOUNTER — Encounter (HOSPITAL_COMMUNITY): Payer: Self-pay

## 2014-12-08 ENCOUNTER — Emergency Department (HOSPITAL_COMMUNITY)
Admission: EM | Admit: 2014-12-08 | Discharge: 2014-12-09 | Discharge status: Against Medical Advice | Payer: Self-pay | Attending: Emergency Medicine | Admitting: Emergency Medicine

## 2014-12-08 DIAGNOSIS — Z720 Tobacco use: Secondary | ICD-10-CM | POA: Insufficient documentation

## 2014-12-08 DIAGNOSIS — R51 Headache: Secondary | ICD-10-CM | POA: Insufficient documentation

## 2014-12-08 NOTE — ED Notes (Signed)
Pt complains of a headache for one week, usually takes Excedrin

## 2014-12-09 NOTE — ED Notes (Signed)
Pt reported to this RN that she would like to leave and does not want to wait for the doctor.

## 2015-02-15 ENCOUNTER — Emergency Department (HOSPITAL_BASED_OUTPATIENT_CLINIC_OR_DEPARTMENT_OTHER)
Admission: EM | Admit: 2015-02-15 | Discharge: 2015-02-15 | Disposition: A | Discharge status: Home / Self Care | Payer: Self-pay | Attending: Emergency Medicine | Admitting: Emergency Medicine

## 2015-02-15 ENCOUNTER — Encounter (HOSPITAL_BASED_OUTPATIENT_CLINIC_OR_DEPARTMENT_OTHER): Payer: Self-pay | Admitting: *Deleted

## 2015-02-15 DIAGNOSIS — Z79899 Other long term (current) drug therapy: Secondary | ICD-10-CM | POA: Insufficient documentation

## 2015-02-15 DIAGNOSIS — H109 Unspecified conjunctivitis: Secondary | ICD-10-CM | POA: Insufficient documentation

## 2015-02-15 DIAGNOSIS — Z720 Tobacco use: Secondary | ICD-10-CM | POA: Insufficient documentation

## 2015-02-15 MED ORDER — TOBRAMYCIN-DEXAMETHASONE 0.3-0.1 % OP SUSP: 2.0000 [drp] | OPHTHALMIC | Status: AC

## 2015-02-15 NOTE — ED Notes (Signed)
Pt cannot complete a visual acuity due to not having her contacts with her. Pt states she cannot see the board.

## 2015-02-15 NOTE — ED Notes (Addendum)
Right eye redness and pain x 2 days no known injuries denies eye discharge or visual changes

## 2015-02-15 NOTE — Discharge Instructions (Signed)

## 2015-02-20 NOTE — ED Provider Notes (Signed)
CSN: [id number]     Arrival date & time 02/15/15  1259 History   First MD Initiated Contact with Patient 02/15/15 1413     Chief Complaint  Patient presents with  . Eye Problem      HPI  Patient presents evaluation of right eye redness and pain for the last 2 days. She wears extended wear lenses. States it is not unusual to sleep in them. However, yesterday morning she awakened her right eye was red and painful. She had some difficulty getting her contact out. Her right eyes remained red and painful. 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 She is able to drive here without any corrective lenses. States her vision is very poor,now, but is normal with her lenses in her glasses on.  History reviewed. No pertinent past medical history. History reviewed. No pertinent past surgical history. History reviewed. No pertinent family history. History  Substance Use Topics  . Smoking status: Current Some Day Smoker    Types: Cigars  . Smokeless tobacco: Never Used  . Alcohol Use: 1.0 oz/week    2 drink(s) per week   OB History    No data available     Review of Systems  Constitutional: Negative for fever, chills, diaphoresis, appetite change and fatigue.  HENT: Negative for mouth sores, sore throat and trouble swallowing.   Eyes: Positive for photophobia, pain and redness. Negative for visual disturbance.  Respiratory: Negative for cough, chest tightness, shortness of breath and wheezing.   Cardiovascular: Negative for chest pain.  Gastrointestinal: Negative for nausea, vomiting, abdominal pain, diarrhea and abdominal distention.  Endocrine: Negative for polydipsia, polyphagia and polyuria.  Genitourinary: Negative for dysuria, frequency and hematuria.  Musculoskeletal: Negative for gait problem.  Skin: Negative for color change, pallor and rash.  Neurological: Negative for dizziness, syncope, light-headedness and headaches.  Hematological: Does not  bruise/bleed easily.  Psychiatric/Behavioral: Negative for behavioral problems and confusion.      Allergies  Review of patient's allergies indicates no known allergies.  Home Medications   Prior to Admission medications   Medication Sig Start Date End Date Taking? Authorizing Provider  aspirin-acetaminophen-caffeine (EXCEDRIN MIGRAINE) [phone] MG per tablet Take 1 tablet by mouth every 6 (six) hours as needed for headache.    Historical Provider, MD  Etonogestrel (NEXPLANON [place]) Inject 1 application into the skin once.    Historical Provider, MD  Multiple Vitamin (MULTIVITAMIN WITH MINERALS) TABS tablet Take 1 tablet by mouth daily.    Historical Provider, MD  tobramycin-dexamethasone [facility]) ophthalmic solution Place 2 drops into the right eye every 4 (four) hours while awake. 02/15/15   Rolland Porter, MD   BP 98/60 mmHg  Pulse 65  Temp(Src) 98.8 F (37.1 C) (Oral)  Resp 18  Ht  (1.575 m)  Wt 110 lb (49.896 kg)  BMI 20.11 kg/m2  SpO2 98% Physical Exam  Constitutional: She is oriented to person, place, and time. She appears well-developed and well-nourished. No distress.  HENT:  Head: Normocephalic.  Eyes: Conjunctivae are normal. Pupils are equal, round, and reactive to light. No scleral icterus.    Neck: Normal range of motion. Neck supple. No thyromegaly present.  Cardiovascular: Normal rate and regular rhythm.  Exam reveals no gallop and no friction rub.   No murmur heard. Pulmonary/Chest: Effort normal and breath sounds normal. No respiratory distress. She has no wheezes. She has no rales.  Abdominal: Soft. Bowel sounds are normal. She exhibits no distension. There is no  tenderness. There is no rebound.  Musculoskeletal: Normal range of motion.  Neurological: She is alert and oriented to person, place, and time.  Skin: Skin is warm and dry. No rash noted.  Psychiatric: She has a normal mood and affect. Her behavior is normal.    ED Course  Procedures  (including critical care time) Labs Review Labs Reviewed - No data to display  Imaging Review No results found.   EKG Interpretation None      MDM   Final diagnoses:  Conjunctivitis of right eye        Rolland Porter, MD 02/20/15 1601

## 2019-04-10 ENCOUNTER — Emergency Department (HOSPITAL_BASED_OUTPATIENT_CLINIC_OR_DEPARTMENT_OTHER)
Admission: EM | Admit: 2019-04-10 | Discharge: 2019-04-11 | Disposition: A | Discharge status: Home / Self Care | Payer: No Typology Code available for payment source | Attending: Emergency Medicine | Admitting: Emergency Medicine

## 2019-04-10 ENCOUNTER — Other Ambulatory Visit: Payer: Self-pay | Admitting: Emergency Medicine

## 2019-04-10 ENCOUNTER — Other Ambulatory Visit: Payer: Self-pay

## 2019-04-10 ENCOUNTER — Emergency Department (HOSPITAL_COMMUNITY): Admission: EM | Admit: 2019-04-10 | Discharge: 2019-04-10 | Discharge status: Absent without leave | Payer: Self-pay

## 2019-04-10 ENCOUNTER — Encounter (HOSPITAL_BASED_OUTPATIENT_CLINIC_OR_DEPARTMENT_OTHER): Payer: Self-pay | Admitting: Emergency Medicine

## 2019-04-10 DIAGNOSIS — Z79899 Other long term (current) drug therapy: Secondary | ICD-10-CM | POA: Insufficient documentation

## 2019-04-10 DIAGNOSIS — Z87891 Personal history of nicotine dependence: Secondary | ICD-10-CM | POA: Insufficient documentation

## 2019-04-10 DIAGNOSIS — N898 Other specified noninflammatory disorders of vagina: Secondary | ICD-10-CM | POA: Diagnosis present

## 2019-04-10 DIAGNOSIS — N760 Acute vaginitis: Secondary | ICD-10-CM | POA: Insufficient documentation

## 2019-04-10 DIAGNOSIS — B9689 Other specified bacterial agents as the cause of diseases classified elsewhere: Secondary | ICD-10-CM

## 2019-04-10 LAB — PREGNANCY, URINE: Preg Test, Ur: NEGATIVE

## 2019-04-10 LAB — URINALYSIS, MICROSCOPIC (REFLEX)

## 2019-04-10 LAB — WET PREP, GENITAL
Sperm: NONE SEEN
Trich, Wet Prep: NONE SEEN
Yeast Wet Prep HPF POC: NONE SEEN

## 2019-04-10 LAB — URINALYSIS, ROUTINE W REFLEX MICROSCOPIC
Bilirubin Urine: NEGATIVE
Glucose, UA: NEGATIVE mg/dL
Ketones, ur: 15 mg/dL — AB
Nitrite: NEGATIVE
Protein, ur: NEGATIVE mg/dL
Specific Gravity, Urine: 1.025 (ref 1.005–1.030)
pH: 6 (ref 5.0–8.0)

## 2019-04-10 MED ORDER — METRONIDAZOLE 0.75 % VA GEL: 1.0000 | Freq: Two times a day (BID) | VAGINAL | 0 refills | Status: DC

## 2019-04-10 MED ORDER — METRONIDAZOLE 500 MG PO TABS
500.0000 mg | ORAL_TABLET | Freq: Once | ORAL | Status: AC
  Administered 2019-04-10: 500 mg via ORAL
  Filled 2019-04-10: qty 1

## 2019-04-10 MED ORDER — NAPROXEN 250 MG PO TABS
500.0000 mg | ORAL_TABLET | Freq: Once | ORAL | Status: AC
  Administered 2019-04-10: 500 mg via ORAL
  Filled 2019-04-10: qty 2

## 2019-04-10 MED ORDER — ACETAMINOPHEN 500 MG PO TABS
1000.0000 mg | ORAL_TABLET | Freq: Once | ORAL | Status: AC
  Administered 2019-04-10: 1000 mg via ORAL
  Filled 2019-04-10: qty 2

## 2019-04-10 MED ORDER — METRONIDAZOLE 500 MG PO TABS: 500.0000 mg | ORAL_TABLET | Freq: Two times a day (BID) | ORAL | 0 refills | Status: DC

## 2019-04-10 NOTE — ED Triage Notes (Signed)
Pt is c/o abd cramping that started 3 days ago  Pt states it is not time for her period

## 2019-04-10 NOTE — ED Provider Notes (Signed)
[facility] EMERGENCY DEPARTMENT Provider Note   CSN: [id number] Arrival date & time: 04/10/19  2149     History   Chief Complaint Chief Complaint  Patient presents with   Abdominal Pain    HPI Kristen Hensley is a 28 y.o. female.     The history is provided by the patient.  Vaginal Discharge Quality:  White Severity:  Moderate Onset quality:  Gradual Duration:  2 weeks Timing:  Constant Progression:  Unchanged Chronicity:  Recurrent Context: spontaneously   Context: not after intercourse   Relieved by:  Nothing Worsened by:  Nothing Ineffective treatments:  None tried Associated symptoms: no abdominal pain, no dyspareunia, no dysuria, no fever, no genital lesions, no nausea, no rash, no urinary frequency, no urinary hesitancy, no urinary incontinence, no vaginal itching and no vomiting   Associated symptoms comment:  Cramps Risk factors: no new sexual partner and no PID   Would like a test for ureaplasma.  State her GYN will not do this.  No f/c/r.  No new partners.      History reviewed. No pertinent past medical history.  There are no active problems to display for this patient.   Past Surgical History:  Procedure Laterality Date   BREAST ENHANCEMENT SURGERY       OB History   No obstetric history on file.      Home Medications    Prior to Admission medications   Medication Sig Start Date End Date Taking? Authorizing Provider  aspirin-acetaminophen-caffeine (EXCEDRIN MIGRAINE) [phone] MG per tablet Take 1 tablet by mouth every 6 (six) hours as needed for headache.    [provider]  Etonogestrel (NEXPLANON [place]) Inject 1 application into the skin once.    [provider]  Multiple Vitamin (MULTIVITAMIN WITH MINERALS) TABS tablet Take 1 tablet by mouth daily.    [provider]  tobramycin-dexamethasone [facility]) ophthalmic solution Place 2 drops into the right eye every 4 (four) hours while awake. 02/15/15    Rolland Porter, MD    Family History Family History  Problem Relation Age of Onset   Diabetes Other    Hypertension Other    Stroke Other    Cancer Other     Social History Social History   Tobacco Use   Smoking status: Former Smoker    Types: Cigars   Smokeless tobacco: Never Used  Substance Use Topics   Alcohol use: Yes    Alcohol/week: 2.0 standard drinks    Types: 2 Standard drinks or equivalent per week   Drug use: No     Allergies   Patient has no known allergies.   Review of Systems Review of Systems  Constitutional: Negative for fever.  HENT: Negative for congestion.   Eyes: Negative for visual disturbance.  Respiratory: Negative for shortness of breath.   Gastrointestinal: Negative for abdominal pain, nausea and vomiting.  Genitourinary: Positive for vaginal discharge. Negative for bladder incontinence, dyspareunia, dysuria and hesitancy.  Skin: Negative for rash.  Neurological: Negative for dizziness.  Psychiatric/Behavioral: Negative for agitation.  All other systems reviewed and are negative.    Physical Exam Updated Vital Signs BP 102/60 (BP Location: Left Arm)    Pulse 71    Temp 99.1 F (37.3 C) (Oral)    Resp 18    Ht 5\' 2"  (1.575 m)    Wt 55.8 kg    LMP 03/20/2019 (Exact Date)    SpO2 100%    BMI 22.50 kg/m   Physical Exam  Vitals signs and nursing note reviewed. Exam conducted with a chaperone present.  Constitutional:      General: She is not in acute distress.    Appearance: She is normal weight.  HENT:     Head: Normocephalic and atraumatic.     Nose: Nose normal.  Eyes:     Extraocular Movements: Extraocular movements intact.  Neck:     Musculoskeletal: Normal range of motion and neck supple.  Cardiovascular:     Rate and Rhythm: Normal rate and regular rhythm.     Pulses: Normal pulses.     Heart sounds: Normal heart sounds.  Pulmonary:     Effort: Pulmonary effort is normal.     Breath sounds: Normal breath sounds.    Abdominal:     General: Abdomen is flat. Bowel sounds are normal.     Tenderness: There is no abdominal tenderness. There is no guarding or rebound.  Genitourinary:    Cervix: Discharge present. No cervical motion tenderness.     Uterus: Normal.      Adnexa: Right adnexa normal and left adnexa normal.     Comments: Scant white discharge Neurological:     Mental Status: She is alert.      ED Treatments / Results  Labs (all labs ordered are listed, but only abnormal results are displayed) Results for orders placed or performed during the hospital encounter of 04/10/19  Wet prep, genital   Specimen: Cervix  Result Value Ref Range   Yeast Wet Prep HPF POC NONE SEEN NONE SEEN   Trich, Wet Prep NONE SEEN NONE SEEN   Clue Cells Wet Prep HPF POC PRESENT (A) NONE SEEN   WBC, Wet Prep HPF POC FEW (A) NONE SEEN   Sperm NONE SEEN   Urinalysis, Routine w reflex microscopic  Result Value Ref Range   Color, Urine YELLOW YELLOW   APPearance CLOUDY (A) CLEAR   Specific Gravity, Urine 1.025 1.005 - 1.030   pH 6.0 5.0 - 8.0   Glucose, UA NEGATIVE NEGATIVE mg/dL   Hgb urine dipstick TRACE (A) NEGATIVE   Bilirubin Urine NEGATIVE NEGATIVE   Ketones, ur 15 (A) NEGATIVE mg/dL   Protein, ur NEGATIVE NEGATIVE mg/dL   Nitrite NEGATIVE NEGATIVE   Leukocytes,Ua SMALL (A) NEGATIVE  Pregnancy, urine  Result Value Ref Range   Preg Test, Ur NEGATIVE NEGATIVE  Urinalysis, Microscopic (reflex)  Result Value Ref Range   RBC / HPF 0-5 0 - 5 RBC/hpf   WBC, UA 6-10 0 - 5 WBC/hpf   Bacteria, UA FEW (A) NONE SEEN   Squamous Epithelial / LPF 6-10 0 - 5   No results found.  Radiology No results found.  Procedures Procedures (including critical care time)  Medications Ordered in ED Medications  naproxen (NAPROSYN) tablet 500 mg (500 mg Oral Given 04/10/19 2333)  acetaminophen (TYLENOL) tablet 1,000 mg (1,000 mg Oral Given 04/10/19 2333)     Patient refused oral flagyl wants gel.  I have changed  the order.  I explained we do not have the test the patient wants and that she may check with the county health department if her GYN does not perform this test.  I do not believe labs or imaging are indicated.    Kristen Hensley was evaluated in Emergency Department on 04/11/2019 for the symptoms described in the history of present illness. She was evaluated in the context of the global COVID-19 pandemic, which necessitated consideration that the patient might be at risk for infection  with the SARS-CoV-2 virus that causes COVID-19. Institutional protocols and algorithms that pertain to the evaluation of patients at risk for COVID-19 are in a state of rapid change based on information released by regulatory bodies including the CDC and federal and state organizations. These policies and algorithms were followed during the patient's care in the ED. Marland Kitchen    Final Clinical Impressions(s) / ED Diagnoses   Return for intractable cough, coughing up blood,fevers >100.4 unrelieved by medication, shortness of breath, intractable vomiting, chest pain, shortness of breath, weakness,numbness, changes in speech, facial asymmetry,abdominal pain, passing out,Inability to tolerate liquids or food, cough, altered mental status or any concerns. No signs of systemic illness or infection. The patient is nontoxic-appearing on exam and vital signs are within normal limits.   I have reviewed the triage vital signs and the nursing notes. Pertinent labs &imaging results that were available during my care of the patient were reviewed by me and considered in my medical decision making (see chart for details).After history, exam, and medical workup I feel the patient has beenappropriately medically screened and is safe for discharge home. Pertinent diagnoses were discussed with the patient. Patient was given return precautions.    Prentice Sackrider, MD 04/11/19 3154

## 2019-04-10 NOTE — ED Notes (Signed)
Pt states malodorous d/c x 1wk, began experiencing cramps x 3 days ago. 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 Last took 2 ibuprofen at 10am with no pain relief. 2 heat packs given for abd pain.

## 2019-04-11 ENCOUNTER — Encounter (HOSPITAL_BASED_OUTPATIENT_CLINIC_OR_DEPARTMENT_OTHER): Payer: Self-pay | Admitting: Emergency Medicine

## 2019-04-12 LAB — CERVICOVAGINAL ANCILLARY ONLY
Chlamydia: POSITIVE — AB
Neisseria Gonorrhea: POSITIVE — AB

## 2019-04-15 ENCOUNTER — Telehealth (HOSPITAL_COMMUNITY): Payer: Self-pay

## 2019-04-15 ENCOUNTER — Other Ambulatory Visit: Payer: Self-pay

## 2019-04-15 ENCOUNTER — Inpatient Hospital Stay (HOSPITAL_BASED_OUTPATIENT_CLINIC_OR_DEPARTMENT_OTHER)
Admission: EM | Admit: 2019-04-15 | Discharge: 2019-04-17 | DRG: 872 | Disposition: A | Discharge status: Home / Self Care | Payer: No Typology Code available for payment source | Attending: Internal Medicine | Admitting: Internal Medicine

## 2019-04-15 ENCOUNTER — Emergency Department (HOSPITAL_BASED_OUTPATIENT_CLINIC_OR_DEPARTMENT_OTHER): Payer: No Typology Code available for payment source

## 2019-04-15 ENCOUNTER — Encounter (HOSPITAL_BASED_OUTPATIENT_CLINIC_OR_DEPARTMENT_OTHER): Payer: Self-pay

## 2019-04-15 DIAGNOSIS — R51 Headache: Secondary | ICD-10-CM | POA: Diagnosis present

## 2019-04-15 DIAGNOSIS — Z87891 Personal history of nicotine dependence: Secondary | ICD-10-CM

## 2019-04-15 DIAGNOSIS — N730 Acute parametritis and pelvic cellulitis: Secondary | ICD-10-CM

## 2019-04-15 DIAGNOSIS — Z20828 Contact with and (suspected) exposure to other viral communicable diseases: Secondary | ICD-10-CM | POA: Diagnosis present

## 2019-04-15 DIAGNOSIS — R1030 Lower abdominal pain, unspecified: Secondary | ICD-10-CM | POA: Diagnosis not present

## 2019-04-15 DIAGNOSIS — M542 Cervicalgia: Secondary | ICD-10-CM | POA: Diagnosis present

## 2019-04-15 DIAGNOSIS — A64 Unspecified sexually transmitted disease: Secondary | ICD-10-CM

## 2019-04-15 DIAGNOSIS — Z8619 Personal history of other infectious and parasitic diseases: Secondary | ICD-10-CM

## 2019-04-15 DIAGNOSIS — R509 Fever, unspecified: Secondary | ICD-10-CM

## 2019-04-15 DIAGNOSIS — A419 Sepsis, unspecified organism: Principal | ICD-10-CM | POA: Diagnosis present

## 2019-04-15 DIAGNOSIS — M436 Torticollis: Secondary | ICD-10-CM | POA: Diagnosis present

## 2019-04-15 DIAGNOSIS — A749 Chlamydial infection, unspecified: Secondary | ICD-10-CM | POA: Diagnosis present

## 2019-04-15 DIAGNOSIS — A549 Gonococcal infection, unspecified: Secondary | ICD-10-CM | POA: Diagnosis present

## 2019-04-15 DIAGNOSIS — R6510 Systemic inflammatory response syndrome (SIRS) of non-infectious origin without acute organ dysfunction: Secondary | ICD-10-CM

## 2019-04-15 DIAGNOSIS — R519 Headache, unspecified: Secondary | ICD-10-CM

## 2019-04-15 LAB — COMPREHENSIVE METABOLIC PANEL
ALT: 15 U/L (ref 0–44)
AST: 21 U/L (ref 15–41)
Albumin: 4.7 g/dL (ref 3.5–5.0)
Alkaline Phosphatase: 54 U/L (ref 38–126)
Anion gap: 14 (ref 5–15)
BUN: 9 mg/dL (ref 6–20)
CO2: 22 mmol/L (ref 22–32)
Calcium: 9.4 mg/dL (ref 8.9–10.3)
Chloride: 100 mmol/L (ref 98–111)
Creatinine, Ser: 0.75 mg/dL (ref 0.44–1.00)
GFR calc Af Amer: >60 mL/min (ref >60)
GFR calc non Af Amer: >60 mL/min (ref >60)
Glucose, Bld: 117 mg/dL — ABNORMAL HIGH (ref 70–99)
Potassium: 3.4 mmol/L — ABNORMAL LOW (ref 3.5–5.1)
Sodium: 136 mmol/L (ref 135–145)
Total Bilirubin: 0.7 mg/dL (ref 0.3–1.2)
Total Protein: 8.2 g/dL — ABNORMAL HIGH (ref 6.5–8.1)

## 2019-04-15 LAB — CBC
HCT: 39.6 % (ref 36.0–46.0)
Hemoglobin: 12.8 g/dL (ref 12.0–15.0)
MCH: 29.5 pg (ref 26.0–34.0)
MCHC: 32.3 g/dL (ref 30.0–36.0)
MCV: 91.2 fL (ref 80.0–100.0)
Platelets: 236 10*3/uL (ref 150–400)
RBC: 4.34 MIL/uL (ref 3.87–5.11)
RDW: 13.3 % (ref 11.5–15.5)
WBC: 12.7 10*3/uL — ABNORMAL HIGH (ref 4.0–10.5)
nRBC: 0 % (ref 0.0–0.2)

## 2019-04-15 LAB — LACTIC ACID, PLASMA: Lactic Acid, Venous: 1.7 mmol/L (ref 0.5–1.9)

## 2019-04-15 MED ORDER — SODIUM CHLORIDE 0.9 % IV SOLN
1.0000 g | Freq: Once | INTRAVENOUS | Status: AC
  Administered 2019-04-16: 1 g via INTRAVENOUS
  Filled 2019-04-15: qty 10

## 2019-04-15 MED ORDER — SODIUM CHLORIDE 0.9 % IV SOLN: 500.0000 mg | Freq: Once | INTRAVENOUS | Status: DC

## 2019-04-15 MED ORDER — PROCHLORPERAZINE EDISYLATE 10 MG/2ML IJ SOLN
10.0000 mg | Freq: Once | INTRAMUSCULAR | Status: AC
  Administered 2019-04-15: 10 mg via INTRAVENOUS
  Filled 2019-04-15: qty 2

## 2019-04-15 MED ORDER — SODIUM CHLORIDE 0.9 % IV SOLN: 2.0000 g | Freq: Once | INTRAVENOUS | Status: DC

## 2019-04-15 MED ORDER — SODIUM CHLORIDE 0.9 % IV BOLUS
1000.0000 mL | Freq: Once | INTRAVENOUS | Status: AC
  Administered 2019-04-15: 1000 mL via INTRAVENOUS

## 2019-04-15 MED ORDER — DIPHENHYDRAMINE HCL 50 MG/ML IJ SOLN
25.0000 mg | Freq: Once | INTRAMUSCULAR | Status: AC
  Administered 2019-04-15: 25 mg via INTRAVENOUS
  Filled 2019-04-15: qty 1

## 2019-04-15 MED ORDER — KETOROLAC TROMETHAMINE 15 MG/ML IJ SOLN
15.0000 mg | Freq: Once | INTRAMUSCULAR | Status: AC
  Administered 2019-04-15: 15 mg via INTRAVENOUS
  Filled 2019-04-15: qty 1

## 2019-04-15 MED ORDER — AZITHROMYCIN 250 MG PO TABS
1000.0000 mg | ORAL_TABLET | Freq: Once | ORAL | Status: AC
  Administered 2019-04-16: 1000 mg via ORAL
  Filled 2019-04-15: qty 4

## 2019-04-15 NOTE — ED Notes (Signed)
Spinal fluid specimens walked to lab.

## 2019-04-15 NOTE — ED Notes (Signed)
ED Provider at bedside. 

## 2019-04-15 NOTE — ED Triage Notes (Addendum)
Pt c/o abd cramps and joint pain-states she was seen for the same last week-vomited x 1 today-no diarrhea-to triage in w/c-NAD

## 2019-04-15 NOTE — ED Notes (Signed)
Patient transported to CT 

## 2019-04-15 NOTE — ED Provider Notes (Signed)
[facility] Emergency Department Provider Note MRN:  [id number]  Arrival date & time: 04/16/19     Chief Complaint   Abdominal Pain   History of Present Illness   Kristen Hensley is a 28 y.o. year-old female with no pertinent past medical history presenting to the ED with chief complaint of abdominal pain.  1 week of lower abdominal pain with vaginal discharge.  Evaluated in the emergency department 5 days ago and discharged medication for BV.  Patient was informed today that her test came back positive for gonorrhea and chlamydia.  She is also been experiencing diffuse joint pain, fever of 100.4 at home, moderate severity headache for the past 2 days, pain in the neck and neck stiffness for 2 days.  Review of Systems  A complete 10 system review of systems was obtained and all systems are negative except as noted in the HPI and PMH.   Patient's Health History   History reviewed. No pertinent past medical history.  Past Surgical History:  Procedure Laterality Date  . BREAST ENHANCEMENT SURGERY      Family History  Problem Relation Age of Onset  . Diabetes Other   . Hypertension Other   . Stroke Other   . Cancer Other     Social History   Socioeconomic History  . Marital status: Single    Spouse name: Not on file  . Number of children: Not on file  . Years of education: Not on file  . Highest education level: Not on file  Occupational History  . Not on file  Social Needs  . Financial resource strain: Not on file  . Food insecurity    Worry: Not on file    Inability: Not on file  . Transportation needs    Medical: Not on file    Non-medical: Not on file  Tobacco Use  . Smoking status: Former Smoker    Types: Cigars  . Smokeless tobacco: Never Used  Substance and Sexual Activity  . Alcohol use: Yes    Comment: weekly  . Drug use: No  . Sexual activity: Not on file  Lifestyle  . Physical activity    Days per week: Not on file   Minutes per session: Not on file  . Stress: Not on file  Relationships  . Social Herbalist on phone: Not on file    Gets together: Not on file    Attends religious service: Not on file    Active member of club or organization: Not on file    Attends meetings of clubs or organizations: Not on file    Relationship status: Not on file  . Intimate partner violence    Fear of current or ex partner: Not on file    Emotionally abused: Not on file    Physically abused: Not on file    Forced sexual activity: Not on file  Other Topics Concern  . Not on file  Social History Narrative  . Not on file     Physical Exam  Vital Signs and Nursing Notes reviewed Vitals:   04/15/19 2254 04/15/19 2315  BP: 118/76 110/68  Pulse: (!) 101 84  Resp: 20 16  Temp: (!) 100.6 F (38.1 C) 99.5 F (37.5 C)  SpO2: 100% 100%    CONSTITUTIONAL: Well-appearing, NAD, appears tired, mildly diaphoretic NEURO:  Alert and oriented x 3, no focal deficits EYES:  eyes equal and reactive ENT/NECK:  no LAD, no JVD  CARDIO: Tachycardic rate, well-perfused, normal S1 and S2 PULM:  CTAB no wheezing or rhonchi GI/GU:  normal bowel sounds, non-distended, non-tender MSK/SPINE:  No gross deformities, no edema SKIN:  no rash, atraumatic PSYCH:  Appropriate speech and behavior  Diagnostic and Interventional Summary    Labs Reviewed  CBC - Abnormal; Notable for the following components:      Result Value   WBC 12.7 (*)    All other components within normal limits  COMPREHENSIVE METABOLIC PANEL - Abnormal; Notable for the following components:   Potassium 3.4 (*)    Glucose, Bld 117 (*)    Total Protein 8.2 (*)    All other components within normal limits  CULTURE, BLOOD (ROUTINE X 2)  CULTURE, BLOOD (ROUTINE X 2)  CSF CULTURE  LACTIC ACID, PLASMA  CSF CELL COUNT WITH DIFFERENTIAL  CSF CELL COUNT WITH DIFFERENTIAL  GLUCOSE, CSF  PROTEIN, CSF    CT Head Wo Contrast  Final Result       Medications  cefTRIAXone (ROCEPHIN) 1 g in sodium chloride 0.9 % 100 mL IVPB (has no administration in time range)  azithromycin (ZITHROMAX) tablet 1,000 mg (has no administration in time range)  HYDROmorphone (DILAUDID) injection 0.5 mg (has no administration in time range)  sodium chloride 0.9 % bolus 1,000 mL (1,000 mLs Intravenous New Bag/Given 04/15/19 2237)  ketorolac (TORADOL) 15 MG/ML injection 15 mg (15 mg Intravenous Given 04/15/19 2245)  diphenhydrAMINE (BENADRYL) injection 25 mg (25 mg Intravenous Given 04/15/19 2244)  prochlorperazine (COMPAZINE) injection 10 mg (10 mg Intravenous Given 04/15/19 2245)     .Lumbar Puncture  Date/Time: 04/16/2019 12:02 AM Performed by: Sabas Sous, MD Authorized by: Sabas Sous, MD   Consent:    Consent obtained:  Verbal and written   Consent given by:  Patient   Risks discussed:  Bleeding, infection, pain, repeat procedure and headache Pre-procedure details:    Procedure purpose:  Diagnostic   Preparation: Patient was prepped and draped in usual sterile fashion   Anesthesia (see MAR for exact dosages):    Anesthesia method:  Local infiltration   Local anesthetic:  Lidocaine 1% w/o epi Procedure details:    Lumbar space:  L4-L5 interspace   Patient position:  L lateral decubitus   Needle gauge:  22   Needle length (in):  3.5   Number of attempts:  1   Fluid appearance:  Clear   Tubes of fluid:  4   Total volume (ml):  4 Post-procedure:    Puncture site:  Adhesive bandage applied   Patient tolerance of procedure:  Tolerated well, no immediate complications   Critical Care Critical Care Documentation Critical care time provided by me (excluding procedures): 32 minutes  Condition necessitating critical care: concern for early sepsis, concern for disseminated gonorrhea  Components of critical care management: reviewing of prior records, laboratory and imaging interpretation, frequent re-examination and reassessment of vital  signs, administration of IV fluids, IV abx, discussion with consulting services.    ED Course and Medical Decision Making  I have reviewed the triage vital signs and the nursing notes.  Pertinent labs & imaging results that were available during my care of the patient were reviewed by me and considered in my medical decision making (see below for details).  The known gonorrhea infection, the joint pains, fever, and the neck stiffness, there is concern for disseminated gonorrhea infection and possibly meningitis.  Patient's abdomen is soft and nontender and discharge is unchanged from 5 days ago,  she does not want an additional pelvic exam today, exam deferred.  Will obtain blood cultures, obtain labs, obtain CT head to exclude intracranial abscess or signs of increased pressure, and if appropriate will obtain lumbar puncture.  CTH unremarkable, LP performed as described above, awaiting results.  Will admit to hospitalist service for further care.  Elmer SowMichael M. Pilar PlateBero, MD [facility] [email or web address]  Final Clinical Impressions(s) / ED Diagnoses     ICD-10-CM   1. Fever, unspecified fever cause  R50.9   2. Gonorrhea  A54.9   3. SIRS (systemic inflammatory response syndrome) (HCC)  R65.10   4. Nonintractable headache, unspecified chronicity pattern, unspecified headache type  R51     ED Discharge Orders    None      Discharge Instructions Discussed with and Provided to Patient: Discharge Instructions   None       Sabas SousBero, Danica Camarena M, MD 04/16/19 [id number]

## 2019-04-16 ENCOUNTER — Inpatient Hospital Stay (HOSPITAL_COMMUNITY): Payer: No Typology Code available for payment source

## 2019-04-16 DIAGNOSIS — R509 Fever, unspecified: Secondary | ICD-10-CM

## 2019-04-16 DIAGNOSIS — A549 Gonococcal infection, unspecified: Secondary | ICD-10-CM | POA: Diagnosis present

## 2019-04-16 DIAGNOSIS — R51 Headache: Secondary | ICD-10-CM | POA: Diagnosis present

## 2019-04-16 DIAGNOSIS — Z8619 Personal history of other infectious and parasitic diseases: Secondary | ICD-10-CM | POA: Diagnosis not present

## 2019-04-16 DIAGNOSIS — A64 Unspecified sexually transmitted disease: Secondary | ICD-10-CM | POA: Diagnosis not present

## 2019-04-16 DIAGNOSIS — M542 Cervicalgia: Secondary | ICD-10-CM | POA: Diagnosis present

## 2019-04-16 DIAGNOSIS — A419 Sepsis, unspecified organism: Secondary | ICD-10-CM | POA: Diagnosis present

## 2019-04-16 DIAGNOSIS — R1030 Lower abdominal pain, unspecified: Secondary | ICD-10-CM | POA: Diagnosis present

## 2019-04-16 DIAGNOSIS — A749 Chlamydial infection, unspecified: Secondary | ICD-10-CM | POA: Diagnosis present

## 2019-04-16 DIAGNOSIS — Z20828 Contact with and (suspected) exposure to other viral communicable diseases: Secondary | ICD-10-CM | POA: Diagnosis present

## 2019-04-16 DIAGNOSIS — M436 Torticollis: Secondary | ICD-10-CM | POA: Diagnosis present

## 2019-04-16 DIAGNOSIS — Z87891 Personal history of nicotine dependence: Secondary | ICD-10-CM | POA: Diagnosis not present

## 2019-04-16 LAB — CRYPTOCOCCAL ANTIGEN, CSF: Crypto Ag: NEGATIVE

## 2019-04-16 LAB — CSF CELL COUNT WITH DIFFERENTIAL
RBC Count, CSF: 0 /mm3
RBC Count, CSF: 33 /mm3 — ABNORMAL HIGH
Tube #: 1
Tube #: 4
WBC, CSF: 1 /mm3 (ref 0–5)
WBC, CSF: 3 /mm3 (ref 0–5)

## 2019-04-16 LAB — SEDIMENTATION RATE: Sed Rate: 17 mm/hr (ref 0–22)

## 2019-04-16 LAB — PROTEIN, CSF: Total  Protein, CSF: 17 mg/dL (ref 15–45)

## 2019-04-16 LAB — HIV ANTIBODY (ROUTINE TESTING W REFLEX): HIV Screen 4th Generation wRfx: NONREACTIVE

## 2019-04-16 LAB — SARS CORONAVIRUS 2 BY RT PCR (HOSPITAL ORDER, PERFORMED IN ~~LOC~~ HOSPITAL LAB): SARS Coronavirus 2: NEGATIVE

## 2019-04-16 LAB — HEPATITIS B SURFACE ANTIGEN: Hepatitis B Surface Ag: NONREACTIVE

## 2019-04-16 LAB — GLUCOSE, CSF: Glucose, CSF: 73 mg/dL — ABNORMAL HIGH (ref 40–70)

## 2019-04-16 MED ORDER — ACYCLOVIR SODIUM 50 MG/ML IV SOLN
INTRAVENOUS | Status: AC
  Administered 2019-04-16: 03:00:00 via INTRAVENOUS
  Filled 2019-04-16: qty 10

## 2019-04-16 MED ORDER — BUTALBITAL-APAP-CAFFEINE 50-325-40 MG PO TABS
2.0000 | ORAL_TABLET | Freq: Once | ORAL | Status: AC
  Administered 2019-04-16: 2 via ORAL
  Filled 2019-04-16: qty 2

## 2019-04-16 MED ORDER — IBUPROFEN 200 MG PO TABS
600.0000 mg | ORAL_TABLET | Freq: Four times a day (QID) | ORAL | Status: DC | PRN
  Administered 2019-04-16 – 2019-04-17 (×2): 600 mg via ORAL
  Filled 2019-04-16 (×2): qty 3

## 2019-04-16 MED ORDER — PHENOL 1.4 % MT LIQD
1.0000 | OROMUCOSAL | Status: DC | PRN
  Filled 2019-04-16: qty 177

## 2019-04-16 MED ORDER — ONDANSETRON HCL 4 MG/2ML IJ SOLN
INTRAMUSCULAR | Status: AC
  Filled 2019-04-16: qty 2

## 2019-04-16 MED ORDER — ACETAMINOPHEN 650 MG RE SUPP: 650.0000 mg | Freq: Four times a day (QID) | RECTAL | Status: DC | PRN

## 2019-04-16 MED ORDER — ACETAMINOPHEN 325 MG PO TABS
650.0000 mg | ORAL_TABLET | Freq: Four times a day (QID) | ORAL | Status: DC | PRN
  Administered 2019-04-16: 650 mg via ORAL
  Filled 2019-04-16: qty 2

## 2019-04-16 MED ORDER — ACETAMINOPHEN 325 MG PO TABS
650.0000 mg | ORAL_TABLET | Freq: Four times a day (QID) | ORAL | Status: DC | PRN
  Administered 2019-04-16 – 2019-04-17 (×2): 650 mg via ORAL
  Filled 2019-04-16 (×2): qty 2

## 2019-04-16 MED ORDER — SODIUM CHLORIDE 0.9 % IV BOLUS (SEPSIS): 1000.0000 mL | Freq: Once | INTRAVENOUS | Status: DC

## 2019-04-16 MED ORDER — SENNOSIDES-DOCUSATE SODIUM 8.6-50 MG PO TABS: 1.0000 | ORAL_TABLET | Freq: Every evening | ORAL | Status: DC | PRN

## 2019-04-16 MED ORDER — ONDANSETRON HCL 4 MG/2ML IJ SOLN
4.0000 mg | Freq: Four times a day (QID) | INTRAMUSCULAR | Status: DC | PRN
  Administered 2019-04-16 – 2019-04-17 (×3): 4 mg via INTRAVENOUS
  Filled 2019-04-16 (×3): qty 2

## 2019-04-16 MED ORDER — SODIUM CHLORIDE 0.9% FLUSH: 3.0000 mL | Freq: Two times a day (BID) | INTRAVENOUS | Status: DC

## 2019-04-16 MED ORDER — DEXTROSE 5 % IV SOLN
500.0000 mg | Freq: Three times a day (TID) | INTRAVENOUS | Status: DC
  Filled 2019-04-16: qty 10

## 2019-04-16 MED ORDER — SODIUM CHLORIDE 0.9 % IV SOLN
INTRAVENOUS | Status: DC
  Administered 2019-04-16 – 2019-04-17 (×4): via INTRAVENOUS

## 2019-04-16 MED ORDER — DOXYCYCLINE HYCLATE 100 MG PO TABS
100.0000 mg | ORAL_TABLET | Freq: Two times a day (BID) | ORAL | Status: DC
  Administered 2019-04-16 – 2019-04-17 (×3): 100 mg via ORAL
  Filled 2019-04-16 (×3): qty 1

## 2019-04-16 MED ORDER — HYDROMORPHONE HCL 1 MG/ML IJ SOLN
0.5000 mg | Freq: Once | INTRAMUSCULAR | Status: AC
  Administered 2019-04-16: 0.5 mg via INTRAVENOUS
  Filled 2019-04-16: qty 1

## 2019-04-16 MED ORDER — ONDANSETRON HCL 4 MG PO TABS
4.0000 mg | ORAL_TABLET | Freq: Four times a day (QID) | ORAL | Status: DC | PRN
  Administered 2019-04-16: 4 mg via ORAL
  Filled 2019-04-16: qty 1

## 2019-04-16 MED ORDER — ENOXAPARIN SODIUM 40 MG/0.4ML ~~LOC~~ SOLN: 40.0000 mg | SUBCUTANEOUS | Status: DC

## 2019-04-16 MED ORDER — SODIUM CHLORIDE 0.9 % IV BOLUS (SEPSIS)
1000.0000 mL | Freq: Once | INTRAVENOUS | Status: AC
  Administered 2019-04-16: 1000 mL via INTRAVENOUS

## 2019-04-16 MED ORDER — DEXTROSE 5 % IV SOLN
500.0000 mg | Freq: Once | INTRAVENOUS | Status: AC
  Administered 2019-04-16: 500 mg via INTRAVENOUS
  Filled 2019-04-16: qty 10

## 2019-04-16 MED ORDER — VANCOMYCIN HCL IN DEXTROSE 750-5 MG/150ML-% IV SOLN
750.0000 mg | Freq: Three times a day (TID) | INTRAVENOUS | Status: DC
  Filled 2019-04-16: qty 150

## 2019-04-16 MED ORDER — SODIUM CHLORIDE 0.9 % IV SOLN: 1.0000 g | INTRAVENOUS | Status: DC

## 2019-04-16 MED ORDER — ONDANSETRON HCL 4 MG/2ML IJ SOLN
4.0000 mg | Freq: Four times a day (QID) | INTRAMUSCULAR | Status: DC | PRN
  Administered 2019-04-16: 4 mg via INTRAVENOUS

## 2019-04-16 MED ORDER — VANCOMYCIN HCL IN DEXTROSE 1-5 GM/200ML-% IV SOLN
1000.0000 mg | Freq: Once | INTRAVENOUS | Status: AC
  Administered 2019-04-16: 1000 mg via INTRAVENOUS
  Filled 2019-04-16: qty 200

## 2019-04-16 MED ORDER — SODIUM CHLORIDE 0.9 % IV SOLN
2.0000 g | INTRAVENOUS | Status: DC
  Administered 2019-04-17: 2 g via INTRAVENOUS
  Filled 2019-04-16: qty 2

## 2019-04-16 MED ORDER — SODIUM CHLORIDE 0.9 % IV BOLUS
500.0000 mL | Freq: Once | INTRAVENOUS | Status: AC
  Administered 2019-04-16: 500 mL via INTRAVENOUS

## 2019-04-16 MED ORDER — KETOROLAC TROMETHAMINE 30 MG/ML IJ SOLN: 30.0000 mg | Freq: Four times a day (QID) | INTRAMUSCULAR | Status: DC | PRN

## 2019-04-16 NOTE — ED Notes (Signed)
Attempted to call report to floor.  Nurse Caryl Comes will return call.

## 2019-04-16 NOTE — ED Notes (Signed)
ED TO INPATIENT HANDOFF REPORT  ED Nurse Name and Phone #: Cleatrice Burke, RN  S Name/Age/Gender Kristen Hensley 28 y.o. female Room/Bed: MH07/MH07  Code Status   Code Status: Not on file  Home/SNF/Other Home Patient oriented to: self, place, time and situation Is this baseline? Yes   Triage Complete: Triage complete  Chief Complaint abd pain  Triage Note Pt c/o abd cramps and joint pain-states she was seen for the same last week-vomited x 1 today-no diarrhea-to triage in w/c-NAD   Allergies No Known Allergies  Level of Care/Admitting Diagnosis ED Disposition    ED Disposition Condition Comment   Admit  Hospital Area: [facility] [[id number]]  Level of Care: Telemetry [5]  Admit to tele based on following criteria: Other see comments  Comments: sepsis  Covid Evaluation: Asymptomatic Screening Protocol (No Symptoms)  Diagnosis: Sepsis [facility]) [[id number]]  Admitting Physician: Lorretta Harp [[id number]]  Attending Physician: Lorretta Harp [phone]  Estimated length of stay: past midnight tomorrow  Certification:: I certify this patient will need inpatient services for at least 2 midnights  PT Class (Do Not Modify): Inpatient [101]  PT Acc Code (Do Not Modify): Private [1]       B Medical/Surgery History History reviewed. No pertinent past medical history. Past Surgical History:  Procedure Laterality Date  . BREAST ENHANCEMENT SURGERY       A IV Location/Drains/Wounds Patient Lines/Drains/Airways Status   Active Line/Drains/Airways    Name:   Placement date:   Placement time:   Site:   Days:   Peripheral IV 04/15/19 Right Antecubital   04/15/19    2233    Antecubital   1   Peripheral IV 04/15/19 Left Antecubital   04/15/19    2237    Antecubital   1          Intake/Output Last 24 hours  Intake/Output Summary (Last 24 hours) at 04/16/2019 0854 Last data filed at 04/16/2019 0345 Gross per 24 hour  Intake 1909.88 ml  Output -  Net 1909.88 ml     Labs/Imaging Results for orders placed or performed during the hospital encounter of 04/15/19 (from the past 48 hour(s))  CBC     Status: Abnormal   Collection Time: 04/15/19 10:22 PM  Result Value Ref Range   WBC 12.7 (H) 4.0 - 10.5 K/uL   RBC 4.34 3.87 - 5.11 MIL/uL   Hemoglobin 12.8 12.0 - 15.0 g/dL   HCT 08.6 76.1 - 95.0 %   MCV 91.2 80.0 - 100.0 fL   MCH 29.5 26.0 - 34.0 pg   MCHC 32.3 30.0 - 36.0 g/dL   RDW 93.2 67.1 - 24.5 %   Platelets 236 150 - 400 K/uL   nRBC 0.0 0.0 - 0.2 %    Comment: Performed at [facility], [address] [place], [place] [address]  Comprehensive metabolic panel     Status: Abnormal   Collection Time: 04/15/19 10:22 PM  Result Value Ref Range   Sodium 136 135 - 145 mmol/L   Potassium 3.4 (L) 3.5 - 5.1 mmol/L   Chloride 100 98 - 111 mmol/L   CO2 22 22 - 32 mmol/L   Glucose, Bld 117 (H) 70 - 99 mg/dL   BUN 9 6 - 20 mg/dL   Creatinine, Ser 3.38 0.44 - 1.00 mg/dL   Calcium 9.4 8.9 - 25.0 mg/dL   Total Protein 8.2 (H) 6.5 - 8.1 g/dL   Albumin 4.7 3.5 - 5.0 g/dL  AST 21 15 - 41 U/L   ALT 15 0 - 44 U/L   Alkaline Phosphatase 54 38 - 126 U/L   Total Bilirubin 0.7 0.3 - 1.2 mg/dL   GFR calc non Af Amer >60 >60 mL/min   GFR calc Af Amer >60 >60 mL/min   Anion gap 14 5 - 15    Comment: Performed at [facility], [address] [place], [place] [address]  Lactic acid, plasma     Status: None   Collection Time: 04/15/19 10:22 PM  Result Value Ref Range   Lactic Acid, Venous 1.7 0.5 - 1.9 mmol/L    Comment: Performed at [facility], [address] [place], [place] [address]  CSF cell count with differential collection tube #: 1     Status: None   Collection Time: 04/15/19 11:36 PM  Result Value Ref Range   Tube # 1     Comment: Performed at [facility], [address] [place], [place] [address]   Color, CSF COLORLESS COLORLESS   Appearance, CSF CLEAR CLEAR   Supernatant NOT INDICATED    RBC  Count, CSF 0 0 /cu mm   WBC, CSF 3 0 - 5 /cu mm   Other Cells, CSF TOO FEW TO COUNT, SMEAR AVAILABLE FOR REVIEW     Comment: FEW LYMPHOCYTES, RARE MONOCYTES Performed at [facility] [address] [place], [place] [address]   CSF cell count with differential collection tube #: 4     Status: Abnormal   Collection Time: 04/15/19 11:36 PM  Result Value Ref Range   Tube # 4     Comment: Performed at [facility], [address] [place], [place] [address]   Color, CSF COLORLESS COLORLESS   Appearance, CSF CLEAR CLEAR   Supernatant NOT INDICATED    RBC Count, CSF 33 (H) 0 /cu mm   WBC, CSF 1 0 - 5 /cu mm   Other Cells, CSF TOO FEW TO COUNT, SMEAR AVAILABLE FOR REVIEW     Comment: FEW LYMPHOCYTES, RARE MONOCYTES, RARE NEUTROPHILS Performed at [facility] [address] [place], [place] [address]   CSF culture     Status: None (Preliminary result)   Collection Time: 04/15/19 11:36 PM   Specimen: Back; Cerebrospinal Fluid  Result Value Ref Range   Specimen Description      BACK Performed at [facility], [address] [place], [place] [address]    Special Requests      NONE Performed at [facility], [address] [place], [place] [address]    Gram Stain      CYTOSPIN SMEAR NO WBC SEEN NO ORGANISMS SEEN Performed at [facility], [place] [address] [place], [place] [address]    Culture PENDING    Report Status PENDING   Glucose, CSF     Status: Abnormal   Collection Time: 04/15/19 11:36 PM  Result Value Ref Range   Glucose, CSF 73 (H) 40 - 70 mg/dL    Comment: Performed at [facility], [address] [place], [place] [address]  Protein, CSF     Status: None   Collection Time: 04/15/19 11:36 PM  Result Value Ref Range   Total  Protein, CSF 17 15 - 45 mg/dL    Comment: Performed at [facility], [address] [place], [place]  [id number]  SARS Coronavirus 2 [facility](Hospital order, Performed in [facility] hospital lab) Nasopharyngeal Nasopharyngeal Swab     Status: None   Collection Time: 04/16/19 12:19 AM   Specimen: Nasopharyngeal Swab  Result Value Ref Range   SARS Coronavirus 2 NEGATIVE NEGATIVE    Comment: (NOTE) If result is NEGATIVE SARS-CoV-2 target nucleic acids are NOT DETECTED. The SARS-CoV-2 RNA is generally detectable in upper and lower  respiratory specimens during the acute phase of infection. The lowest  concentration of SARS-CoV-2 viral copies this assay can detect is 250  copies / mL. A negative result does not preclude SARS-CoV-2 infection  and should not be used as the sole basis for treatment or other  patient management decisions.  A negative result may occur with  improper specimen collection / handling, submission of specimen other  than nasopharyngeal swab, presence of viral mutation(s) within the  areas targeted by this assay, and inadequate number of viral copies  (<250 copies / mL). A negative result must be combined with clinical  observations, patient history, and epidemiological information. If result is POSITIVE SARS-CoV-2 target nucleic acids are DETECTED. The SARS-CoV-2 RNA is generally detectable in upper and lower  respiratory specimens dur ing the acute phase of infection.  Positive  results are indicative of active infection with SARS-CoV-2.  Clinical  correlation with patient history and other diagnostic information is  necessary to determine patient infection status.  Positive results do  not rule out bacterial infection or co-infection with other viruses. If result is PRESUMPTIVE POSTIVE SARS-CoV-2 nucleic acids MAY BE PRESENT.   A presumptive positive result was obtained on the submitted specimen  and confirmed on repeat testing.  While 2019 novel coronavirus  (SARS-CoV-2) nucleic acids may be present in the submitted sample  additional confirmatory testing may be necessary for epidemiological  and / or clinical management purposes  to  differentiate between  SARS-CoV-2 and other Sarbecovirus currently known to infect humans.  If clinically indicated additional testing with an alternate test  methodology [phone]) is advised. The SARS-CoV-2 RNA is generally  detectable in upper and lower respiratory sp ecimens during the acute  phase of infection. The expected result is Negative. Fact Sheet for Patients:  BoilerBrush.com.cyhttps://www.fda.gov/media/136312/download Fact Sheet for Healthcare Providers: https://pope.com/https://www.fda.gov/media/136313/download This test is not yet approved or cleared by the [place] FDA and has been authorized for detection and/or diagnosis of SARS-CoV-2 by FDA under an Emergency Use Authorization (EUA).  This EUA will remain in effect (meaning this test can be used) for the duration of the COVID-19 declaration under Section 564(b)(1) of the Act, 21 U.S.C. section 360bbb-3(b)(1), unless the authorization is terminated or revoked sooner. Performed at [facility], [address] [place], [place] [id number]    Ct Head Wo Contrast  Result Date: 04/15/2019 CLINICAL DATA:  Headache with vomiting EXAM: CT HEAD WITHOUT CONTRAST TECHNIQUE: Contiguous axial images were obtained from the base of the skull through the vertex without intravenous contrast. COMPARISON:  None. FINDINGS: Brain: No evidence of acute infarction, hemorrhage, hydrocephalus, extra-axial collection or mass lesion/mass effect. Vascular: No hyperdense vessel or unexpected calcification. Skull: Normal. Negative for fracture or focal lesion. Sinuses/Orbits: No acute finding. Other: None IMPRESSION: Negative non contrasted CT appearance of the brain Electronically Signed   By: Jasmine PangKim  Fujinaga M.D.   On: 04/15/2019 22:55    Pending Labs Unresulted Labs (From admission, onward)    Start     Ordered   04/15/19 2222  Culture, blood (  Routine X 2) w Reflex to ID Panel  BLOOD CULTURE X 2,   R     04/15/19 2223          Vitals/Pain Today's Vitals    04/16/19 0730 04/16/19 0738 04/16/19 0746 04/16/19 0850  BP:  (!) 93/59    Pulse:  98    Resp:  16    Temp:   (!) 101.4 F (38.6 C)   TempSrc:   Oral   SpO2:  96%    PainSc: 3    2     Isolation Precautions No active isolations  Medications Medications  0.9 %  sodium chloride infusion ( Intravenous New Bag/Given 04/16/19 0132)  acetaminophen (TYLENOL) tablet 650 mg (650 mg Oral Given 04/16/19 0748)  cefTRIAXone (ROCEPHIN) 1 g in sodium chloride 0.9 % 100 mL IVPB (has no administration in time range)  ondansetron (ZOFRAN) injection 4 mg (0 mg Intravenous Return to [facility] 04/16/19 0755)  vancomycin (VANCOCIN) IVPB 750 mg/150 ml premix (has no administration in time range)  acyclovir (ZOVIRAX) 500 mg in dextrose 5 % 100 mL IVPB (has no administration in time range)  sodium chloride 0.9 % bolus 1,000 mL (0 mLs Intravenous Stopped 04/15/19 2335)  ketorolac (TORADOL) 15 MG/ML injection 15 mg (15 mg Intravenous Given 04/15/19 2245)  diphenhydrAMINE (BENADRYL) injection 25 mg (25 mg Intravenous Given 04/15/19 2244)  prochlorperazine (COMPAZINE) injection 10 mg (10 mg Intravenous Given 04/15/19 2245)  cefTRIAXone (ROCEPHIN) 1 g in sodium chloride 0.9 % 100 mL IVPB ( Intravenous Stopped 04/16/19 0046)  azithromycin (ZITHROMAX) tablet 1,000 mg (1,000 mg Oral Given 04/16/19 0008)  HYDROmorphone (DILAUDID) injection 0.5 mg (0.5 mg Intravenous Given 04/16/19 0006)  sodium chloride 0.9 % bolus 500 mL (0 mLs Intravenous Stopped 04/16/19 0133)  vancomycin (VANCOCIN) IVPB 1000 mg/200 mL premix ( Intravenous Stopped 04/16/19 0233)  acyclovir (ZOVIRAX) 500 mg in dextrose 5 % 100 mL IVPB ( Intravenous Stopped 04/16/19 0341)  acyclovir (ZOVIRAX) 50 MG/ML injection ( Intravenous Given 04/16/19 0237)  sodium chloride 0.9 % bolus 1,000 mL (0 mLs Intravenous Stopped 04/16/19 0653)    Mobility walks Low fall risk   Focused Assessments Cardiac Assessment Handoff:  Cardiac Rhythm: Normal sinus rhythm No results  found for: CKTOTAL, CKMB, CKMBINDEX, TROPONINI No results found for: DDIMER Does the Patient currently have chest pain? No      R Recommendations: See Admitting Provider Note  Report given to: Renae Fickle in [place]  Additional Notes:

## 2019-04-16 NOTE — ED Provider Notes (Addendum)
12:45 AM  Assumed care from Dr. Sedonia Small.  Patient is a 28 year old female who presented to the emergency department with 2 days of fevers, headache, neck pain and neck stiffness, joint pain.   Patient denying abdominal pain.  Benign abdomen per previous provider.  Patient positive for GC and Chlamydia.  Decline repeat pelvic exam. She has been febrile, tachycardic and has a leukocytosis of 12.7.  CSF pending to evaluate for meningitis, disseminated gonorrhea.  Blood cultures pending.  Patient has received Rocephin and azithromycin.  Awaiting discussion with hospitalist for admission.  Patient meets sirs criteria.  12:49 AM Discussed patient's case with hospitalist, Dr. Blaine Hamper.  I have recommended admission and patient (and family if present) agree with this plan. Admitting physician will place admission orders.   Hospitalist would like for me to add on vancomycin and acyclovir as well.  These medications have been ordered.  30 mL/kg IVF bolus ordered as well.  Lactate normal.  BP normal.  COVID pending.  I reviewed all nursing notes, vitals, pertinent previous records, EKGs, lab and urine results, imaging (as available).   1:30 AM  COVID negative.  CSF studies pending.   4:40 AM  Pt resting without complaints.  She has had some slight drop in blood pressure with sleeping.  CSF does not suggest meningitis.  I do not feel at this time she needs further vancomycin or acyclovir.  We will continue Rocephin 1 g every 24 hours for concerns for disseminated gonorrhea.  This medication has been ordered.  She is awaiting a bed still at [facility].   5:50 AM  Pt has had slight drop in blood pressure while resting.  Will give another liter IV fluid bolus.  Able to ambulate to the bathroom without difficulty.  6:15 AM  Pt had one episode of nonbloody, nonbilious vomiting.  She denies any abdominal pain or headache.  Abdominal exam is benign.  Still having mild joint pain.  No dizziness, lightheadedness.   Currently blood pressure is 112/57.  She has been updated with plan.  Given Zofran for nausea.   Kristen Hensley was evaluated in Emergency Department on 04/16/2019 for the symptoms described in the history of present illness. She was evaluated in the context of the global COVID-19 pandemic, which necessitated consideration that the patient might be at risk for infection with the SARS-CoV-2 virus that causes COVID-19. Institutional protocols and algorithms that pertain to the evaluation of patients at risk for COVID-19 are in a state of rapid change based on information released by regulatory bodies including the CDC and federal and state organizations. These policies and algorithms were followed during the patient's care in the ED.             , Kristen Bison, DO 04/16/19 [phone]

## 2019-04-16 NOTE — Care Management (Signed)
This is a no charge note  Transfer from [facility] per Dr. Leonides Schanz  28 year old lady without significant past medical history who presents with nausea, vomiting, abdominal pain and diffuse joint pain.  Her abdominal pain has subsided.  Also has fever, headache, neck stiffness. Pt was seen in ED on 9/23 and was diagnosed as BV. She was discharged home on Flagyl vaginal gel. Her test came back also positive for GC and Chlamydia. LP was performed, with pending CSF analysis.  Suspect meningitis and gonorrhea. Pt was given Rocephin, azithromycin 1 g, vancomycin and acyclovir empirically.  Pt was found to have WBC 12.7, pending COVID-19 test, urinalysis (cloudy appearance, small amount of leukocyte, few bacteria, WBC 6-10), temperature 100.6, blood pressure 106/73, heart rate 82, while 1, oxygen saturation 99% on room air.  CT head is negative for acute intracranial abnormalities.  Patient is admitted to telemetry bed as inpatient.   Please call manager of [facility] at [phone] when pt arrives to floor   Ivor Costa, MD  [facility]   If 7PM-7AM, please contact night-coverage [email or web address] Password [facility] 04/16/2019, 3:57 AM

## 2019-04-16 NOTE — Progress Notes (Signed)
Pharmacy Antibiotic Note  Kristen Hensley is a 28 y.o. female admitted on 04/15/2019 with possible meningitis.  Pharmacy has been consulted for Vancomycin and Acyclovir dosing.  Plan: Vancomycin 1 g IV now, then 750 mg IV q8h Acyclovir 500 mg IV q8h     Temp (24hrs), Avg:100 F (37.8 C), Min:99.5 F (37.5 C), Max:100.6 F (38.1 C)  Recent Labs  Lab 04/15/19 2222  WBC 12.7*  CREATININE 0.75  LATICACIDVEN 1.7    Estimated Creatinine Clearance: 82.8 mL/min (by C-G formula based on SCr of 0.75 mg/dL).    No Known Allergies  Caryl Pina 04/16/2019 1:23 AM

## 2019-04-16 NOTE — Progress Notes (Signed)
Pt BP 85/46, HR 55. Pt states BP tends to run low as her normal and basal HR runs low. Pt is asymptomatic. Will continue to monitor.

## 2019-04-16 NOTE — H&P (Signed)
[facility] History and Physical   Patient: Kristen Hensley BDZ:[id number]   PCP: Patient, No Pcp Per DOB: 1991/03/18   DOA: 04/15/2019   DOS: 04/16/2019   DOS: the patient was seen and examined on 04/16/2019  Patient coming from: The patient is coming from Home  Chief Complaint: Back pain headache and neck pain  HPI: Kristen Hensley is a 28 y.o. female with Past medical history of recurrent STDs. Patient presents with complaints of back pain neck pain and joint pain. He also reports severe headache on admission. She had 2-day history of this along with fever. No abdominal pain no nausea no vomiting. Patient was recently seen in the ER at which time.  Gonococcal and Chlamydia probe performed which were positive.  Patient had a temp of 100.4 at home.  She reports that she had a headache that would not improve. At the time of my evaluation patient reports that the only complaint right now is back pain where she had LP.  No nausea no vomiting no headache no focal deficit. She still has some neck pain but no neck stiffness. No diarrhea no constipation at home. No burning urination.   ED Course: With above complaint patient underwent CT of the head which was unremarkable.  Lumbar puncture was performed.  Patient was referred for further admission.  At her baseline ambulates without assistance independent for most of her ADL;  manages her medication on her own.  Review of Systems: as mentioned in the history of present illness.  All other systems reviewed and are negative.  History reviewed. No pertinent past medical history. Past Surgical History:  Procedure Laterality Date  . BREAST ENHANCEMENT SURGERY     Social History:  reports that she has quit smoking. Her smoking use included cigars. She has never used smokeless tobacco. She reports current alcohol use. She reports that she does not use drugs.  No Known Allergies  Family history reviewed and not pertinent Family History   Problem Relation Age of Onset  . Diabetes Other   . Hypertension Other   . Stroke Other   . Cancer Other      Prior to Admission medications   Medication Sig Start Date End Date Taking? Authorizing Provider  metroNIDAZOLE (METROGEL VAGINAL) 0.75 % vaginal gel Place 1 Applicatorful vaginally 2 (two) times daily. Patient not taking: Reported on 04/16/2019 04/10/19   Palumbo, April, MD  tobramycin-dexamethasone [facility]) ophthalmic solution Place 2 drops into the right eye every 4 (four) hours while awake. Patient not taking: Reported on 04/16/2019 02/15/15   Tanna Furry, MD    Physical Exam: Vitals:   04/16/19 0848 04/16/19 0939 04/16/19 1216 04/16/19 1319  BP: (!) 95/51 91/60  111/68  Pulse: 87 83  69  Resp: 16 18  17   Temp:  98.6 F (37 C)  99.2 F (37.3 C)  TempSrc:  Oral  Oral  SpO2: 97% 99%  100%  Weight:   55 kg   Height:   5\' 2"  (1.575 m)     General: alert and oriented to time, place, and person. Appear in mild distress, affect appropriate Eyes: PERRL, Conjunctiva normal ENT: Oral Mucosa Clear, moist  Neck: no JVD, no Abnormal Mass Or lumps Cardiovascular: S1 and S2 Present, no Murmur, peripheral pulses symmetrical Respiratory: good respiratory effort, Bilateral Air entry equal and Decreased, no signs of accessory muscle use, Clear to Auscultation, no Crackles, no wheezes Abdomen: Bowel Sound present, Soft and no tenderness, no hernia Skin: no rashes  Extremities: no Pedal edema, no calf tenderness Neurologic: without any new focal findings Gait not checked due to patient safety concerns  Data Reviewed: I have personally reviewed and interpreted labs, imaging as discussed below.  CBC: Recent Labs  Lab 04/15/19 2222  WBC 12.7*  HGB 12.8  HCT 39.6  MCV 91.2  PLT 236   Basic Metabolic Panel: Recent Labs  Lab 04/15/19 2222  NA 136  K 3.4*  CL 100  CO2 22  GLUCOSE 117*  BUN 9  CREATININE 0.75  CALCIUM 9.4   GFR: Estimated Creatinine Clearance: 82.8  mL/min (by C-G formula based on SCr of 0.75 mg/dL). Liver Function Tests: Recent Labs  Lab 04/15/19 2222  AST 21  ALT 15  ALKPHOS 54  BILITOT 0.7  PROT 8.2*  ALBUMIN 4.7   No results for input(s): LIPASE, AMYLASE in the last 168 hours. No results for input(s): AMMONIA in the last 168 hours. Coagulation Profile: No results for input(s): INR, PROTIME in the last 168 hours. Cardiac Enzymes: No results for input(s): CKTOTAL, CKMB, CKMBINDEX, TROPONINI in the last 168 hours. BNP (last 3 results) No results for input(s): PROBNP in the last 8760 hours. HbA1C: No results for input(s): HGBA1C in the last 72 hours. CBG: No results for input(s): GLUCAP in the last 168 hours. Lipid Profile: No results for input(s): CHOL, HDL, LDLCALC, TRIG, CHOLHDL, LDLDIRECT in the last 72 hours. Thyroid Function Tests: No results for input(s): TSH, T4TOTAL, FREET4, T3FREE, THYROIDAB in the last 72 hours. Anemia Panel: No results for input(s): VITAMINB12, FOLATE, FERRITIN, TIBC, IRON, RETICCTPCT in the last 72 hours. Urine analysis:    Component Value Date/Time   COLORURINE YELLOW 04/10/2019 2219   APPEARANCEUR CLOUDY (A) 04/10/2019 2219   LABSPEC 1.025 04/10/2019 2219   PHURINE 6.0 04/10/2019 2219   GLUCOSEU NEGATIVE 04/10/2019 2219   HGBUR TRACE (A) 04/10/2019 2219   BILIRUBINUR NEGATIVE 04/10/2019 2219   KETONESUR 15 (A) 04/10/2019 2219   PROTEINUR NEGATIVE 04/10/2019 2219   UROBILINOGEN 0.2 11/23/2008 1422   NITRITE NEGATIVE 04/10/2019 2219   LEUKOCYTESUR SMALL (A) 04/10/2019 2219    Radiological Exams on Admission: Ct Head Wo Contrast  Result Date: 04/15/2019 CLINICAL DATA:  Headache with vomiting EXAM: CT HEAD WITHOUT CONTRAST TECHNIQUE: Contiguous axial images were obtained from the base of the skull through the vertex without intravenous contrast. COMPARISON:  None. FINDINGS: Brain: No evidence of acute infarction, hemorrhage, hydrocephalus, extra-axial collection or mass lesion/mass  effect. Vascular: No hyperdense vessel or unexpected calcification. Skull: Normal. Negative for fracture or focal lesion. Sinuses/Orbits: No acute finding. Other: None IMPRESSION: Negative non contrasted CT appearance of the brain Electronically Signed   By: Jasmine Pang M.D.   On: 04/15/2019 22:55   US Pelvic Complete With Transvaginal  Result Date: 04/16/2019 CLINICAL DATA:  Pelvic inflammatory disease. EXAM: TRANSABDOMINAL AND TRANSVAGINAL ULTRASOUND OF PELVIS TECHNIQUE: Both transabdominal and transvaginal ultrasound examinations of the pelvis were performed. Transabdominal technique was performed for global imaging of the pelvis including uterus, ovaries, adnexal regions, and pelvic cul-de-sac. It was necessary to proceed with endovaginal exam following the transabdominal exam to visualize the ovaries and endometrium. COMPARISON:  None FINDINGS: Uterus Measurements: 7.8 x 4.2 x 5.2 cm = volume: 88 mL. No fibroids or other mass visualized. Endometrium Thickness: 3.0.  No focal abnormality visualized. Right ovary Measurements: 3.5 x 2.6 x 1.9 cm = volume: 8.9 mL. Normal appearance/no adnexal mass. Left ovary Measurements: 3.6 x 1.6 x 2.3 cm = volume: 5.9 mL. 1.8  x 1.9 x 1.9 cm complex cyst, likely hemorrhagic. Other findings Small amount of free pelvic fluid. IMPRESSION: 1. Normal sonographic appearance of the uterus. 2. Normal right ovary. 3. Small complex cyst associated with the left ovary, likely a hemorrhagic or collapsing cyst. 4. Small amount of free pelvic fluid. Electronically Signed   By: Rudie MeyerP.  Gallerani M.D.   On: 04/16/2019 12:42    I reviewed all nursing notes, pharmacy notes, vitals, pertinent old records.  Assessment/Plan 1.  Sepsis. Likely PID. Concern for meningitis-ruled out Reported positive GC chlamydia probe although report not available in epic. Presents with complaints of back pain neck pain fever. There was initial concern for chronic meningitis. CT head unremarkable.  Lumbar picture performed.  CSF analysis shows no evidence of acute infection.  No pleocytosis. Patient's symptoms have improved. We will add cryptococcal antigen due to the patient's complaints of headache. Although we will discontinue to biotics for meningitis coverage. Will initiate antibiotic for PID coverage with IV ceftriaxone and doxycycline. Ultrasound pelvis with transvaginal shows normal uterus normal ovary without any acute abnormality. Follow-up on blood cultures. Continue with IV hydration.  2.  Frequent STD Check HIV, hep B, hep C. Recommend safe sex.  3.  Headache. PRN Fioricet.   Nutrition: Regular diet DVT Prophylaxis: Subcutaneous Lovenox  Advance goals of care discussion: Full code   Consults: none   Family Communication: no family was present at bedside, at the time of interview.  Disposition: Admitted as inpatient, med-surge unit. Likely to be discharged home, in 1-2 days.  I have discussed plan of care as described above with RN and patient/family.   Author: Lynden OxfordPranav Patel, MD [facility] 04/16/2019 8:15 PM   To reach On-call, see care teams to locate the attending and reach out to them via [email or web address]. If 7PM-7AM, please contact night-coverage If you still have difficulty reaching the attending provider, please page the [facility]) for [facility] on amion for assistance.

## 2019-04-16 NOTE — ED Notes (Signed)
Belongings bagged.  Pt repositioned.  Pt c/o Headache and generalized body aches.

## 2019-04-16 NOTE — ED Notes (Signed)
Acyclovir Infusion only given one time.

## 2019-04-16 NOTE — ED Notes (Signed)
Pt tolerated LP well

## 2019-04-17 DIAGNOSIS — A64 Unspecified sexually transmitted disease: Secondary | ICD-10-CM

## 2019-04-17 LAB — COMPREHENSIVE METABOLIC PANEL
ALT: 206 U/L — ABNORMAL HIGH (ref 0–44)
AST: 171 U/L — ABNORMAL HIGH (ref 15–41)
Albumin: 3.3 g/dL — ABNORMAL LOW (ref 3.5–5.0)
Alkaline Phosphatase: 68 U/L (ref 38–126)
Anion gap: 9 (ref 5–15)
BUN: <5 mg/dL — ABNORMAL LOW (ref 6–20)
CO2: 20 mmol/L — ABNORMAL LOW (ref 22–32)
Calcium: 8.2 mg/dL — ABNORMAL LOW (ref 8.9–10.3)
Chloride: 111 mmol/L (ref 98–111)
Creatinine, Ser: 0.49 mg/dL (ref 0.44–1.00)
GFR calc Af Amer: >60 mL/min (ref >60)
GFR calc non Af Amer: >60 mL/min (ref >60)
Glucose, Bld: 107 mg/dL — ABNORMAL HIGH (ref 70–99)
Potassium: 3.5 mmol/L (ref 3.5–5.1)
Sodium: 140 mmol/L (ref 135–145)
Total Bilirubin: 0.6 mg/dL (ref 0.3–1.2)
Total Protein: 5.9 g/dL — ABNORMAL LOW (ref 6.5–8.1)

## 2019-04-17 LAB — CBC
HCT: 32.5 % — ABNORMAL LOW (ref 36.0–46.0)
Hemoglobin: 10.5 g/dL — ABNORMAL LOW (ref 12.0–15.0)
MCH: 29.5 pg (ref 26.0–34.0)
MCHC: 32.3 g/dL (ref 30.0–36.0)
MCV: 91.3 fL (ref 80.0–100.0)
Platelets: 214 10*3/uL (ref 150–400)
RBC: 3.56 MIL/uL — ABNORMAL LOW (ref 3.87–5.11)
RDW: 13.7 % (ref 11.5–15.5)
WBC: 8.2 10*3/uL (ref 4.0–10.5)
nRBC: 0 % (ref 0.0–0.2)

## 2019-04-17 LAB — HCV INTERPRETATION

## 2019-04-17 LAB — HCV AB W REFLEX TO QUANT PCR: HCV Ab: <0.1 s/co ratio (ref 0.0–0.9)

## 2019-04-17 MED ORDER — CEFDINIR 300 MG PO CAPS: 300.0000 mg | ORAL_CAPSULE | Freq: Two times a day (BID) | ORAL | 0 refills | Status: AC

## 2019-04-17 MED ORDER — ZOLPIDEM TARTRATE 5 MG PO TABS
5.0000 mg | ORAL_TABLET | Freq: Every evening | ORAL | Status: DC | PRN
  Administered 2019-04-17: 5 mg via ORAL
  Filled 2019-04-17: qty 1

## 2019-04-17 MED ORDER — DOXYCYCLINE HYCLATE 100 MG PO TABS: 100.0000 mg | ORAL_TABLET | Freq: Two times a day (BID) | ORAL | 0 refills | Status: AC

## 2019-04-17 NOTE — Progress Notes (Signed)
Pt given discharge instructions and all questions answered. Pt will discharge home with boyfriend.

## 2019-04-17 NOTE — Discharge Instructions (Signed)
Chlamydia, Female  Chlamydia is a STD (sexually transmitted disease). This is an infection that spreads through sexual contact. If it is not treated, it can cause serious problems. It must be treated with antibiotic medicine. If this infection is not treated and you are pregnant or become pregnant, your baby could get it during delivery. This may cause bad health problems for the baby. Sometimes, you may not have symptoms (asymptomatic). When you have symptoms, they can include:  Burning when you pee (urinate).  Peeing often.  Fluid (discharge) coming from the vagina.  Redness, soreness, and swelling (inflammation) of the butt (rectum).  Bleeding or fluid coming from the butt.  Belly (abdominal) pain.  Pain during sex.  Bleeding between periods.  Itching, burning, or redness in the eyes.  Fluid coming from the eyes. Follow these instructions at home: Medicines  Take over-the-counter and prescription medicines only as told by your doctor.  Take your antibiotic medicine as told by your doctor. Do not stop taking the antibiotic even if you start to feel better. Sexual activity  Tell sex partners about your infection. Sex partners are people you had oral, anal, or vaginal sex with within 60 days of when you started getting sick. They need treatment, too.  Do not have sex until: ? You and your sex partners have been treated. ? Your doctor says it is okay.  If you have a single dose treatment, wait 7 days before having sex. General instructions  It is up to you to get your test results. Ask your doctor when your results will be ready.  Get a lot of rest.  Eat healthy foods.  Drink enough fluid to keep your pee (urine) clear or pale yellow.  Keep all follow-up visits as told by your doctor. You may need tests after 3 months. Preventing chlamydia  The only way to prevent chlamydia is not to have sex. To lower your risk: ? Use latex condoms correctly. Do this every time  you have sex. ? Avoid having many sex partners. ? Ask if your partner has been tested for STDs and if he or she had negative results. Contact a doctor if:  You get new symptoms.  You do not get better with treatment.  You have a fever or chills.  You have pain during sex. Get help right away if:  Your pain gets worse and does not get better with medicine.  You get flu-like symptoms, such as: ? Night sweats. ? Sore throat. ? Muscle aches.  You feel sick to your stomach (nauseous).  You throw up (vomit).  You have trouble swallowing.  You have bleeding: ? Between periods. ? After sex.  You have irregular periods.  You have belly pain that does not get better with medicine.  You have lower back pain that does not get better with medicine.  You feel weak or dizzy.  You pass out (faint).  You are pregnant and you get symptoms of chlamydia. Summary  Chlamydia is an infection that spreads through sexual contact.  Sometimes, chlamydia can cause no symptoms (asymptomatic).  Do not have sex until your doctor says it is okay.  All sex partners will have to be treated for chlamydia. This information is not intended to replace advice given to you by your health care provider. Make sure you discuss any questions you have with your health care provider. Document Released: 04/12/2008 Document Revised: 12/26/2017 Document Reviewed: 06/23/2016 Elsevier Patient Education  2020 [employer].  Gonorrhea Gonorrhea is  a sexually transmitted disease (STD) that can affect both men and women. If left untreated, this infection can:  Damage the female or female organs.  Cause women and men to be unable to have children (be sterile).  Harm a fetus if an infected woman is pregnant. It is important to get treatment for gonorrhea as soon as possible. It is also necessary for all of your sexual partners to be tested for the infection. What are the causes? This condition is caused by  bacteria called Neisseria gonorrhoeae. The infection is spread from person to person through sexual contact, including oral, anal, and vaginal sex. A newborn can contract the infection from his or her mother during birth. What increases the risk? The following factors may make you more likely to develop this condition:  Being a woman who is younger than 28 years of age and who is sexually active.  Being a woman 28 years of age or older who has: ? A new sex partner. ? More than one sex partner. ? A sex partner who has an STD.  Being a man who has: ? A new sex partner. ? More than one sex partner. ? A sex partner who has an STD.  Using condoms inconsistently.  Currently having, or having previously had, an STD.  Exchanging sex for money or drugs. What are the signs or symptoms? Some people do not have any symptoms. If you do have symptoms, they may be different for females and males. For females  Pain in the lower abdomen.  Abnormal vaginal discharge. The discharge may be cloudy, thick, or yellow-green in color.  Bleeding between periods.  Painful sex.  Burning or itching in and around the vagina.  Pain or burning when urinating.  Irritation, pain, bleeding, or discharge from the rectum. This may occur if the infection was spread by anal sex.  Sore throat or swollen lymph nodes in the neck. This may occur if the infection was spread by oral sex. For males  Abnormal discharge from the penis. This discharge may be cloudy, thick, or yellow-green in color.  Pain or burning during urination.  Pain or swelling in the testicles.  Irritation, pain, bleeding, or discharge from the rectum. This may occur if the infection was spread by anal sex.  Sore throat, fever, or swollen lymph nodes in the neck. This may occur if the infection was spread by oral sex. How is this diagnosed? This condition is diagnosed based on:  A physical exam.  A sample of discharge that is examined  under a microscope to look for the bacteria. The discharge may be taken from the urethra, cervix, throat, or rectum.  Urine tests. Not all of test results will be available during your visit. How is this treated? This condition is treated with antibiotic medicines. It is important for treatment to begin as soon as possible. Early treatment may prevent some problems from developing. Do not have sex during treatment. Avoid all types of sexual activity for 7 days after treatment is complete and until any sex partners have been treated. Follow these instructions at home:  Take over-the-counter and prescription medicines only as told by your health care provider.  Take your antibiotic medicine as told by your health care provider. Do not stop taking the antibiotic even if you start to feel better.  Do not have sex until at least 7 days after you and your partner(s) have finished treatment and your health care provider says it is okay.  It  is your responsibility to get your test results. Ask your health care provider, or the department performing the test, when your results will be ready.  If you test positive for gonorrhea, inform your recent sexual partners. This includes any oral, anal, or vaginal sex partners. They need to be checked for gonorrhea even if they do not have symptoms. They may need treatment, even if they test negative for gonorrhea.  Keep all follow-up visits as told by your health care provider. This is important. How is this prevented?   Use latex condoms correctly every time you have sexual intercourse.  Ask if your sexual partner has been tested for STDs and had negative results.  Avoid having multiple sexual partners. Contact a health care provider if:  You develop a bad reaction to the medicine you were prescribed. This may include: ? A rash. ? Nausea. ? Vomiting. ? Diarrhea.  Your symptoms do not get better after a few days of taking antibiotics.  Your  symptoms get worse.  You develop new symptoms.  Your pain gets worse.  You have a fever.  You develop pain, itching, or discharge around the eyes. Get help right away if:  You feel dizzy or faint.  You have trouble breathing or have shortness of breath.  You develop an irregular heartbeat.  You have severe abdominal pain with or without shoulder pain.  You develop any bumps or sores (lesions) on your skin.  You develop warmth, redness, pain, or swelling around your joints, such as the knee. Summary  Gonorrhea is an STD that can affect both men and women.  This condition is caused by bacteria called Neisseria gonorrhoeae. The infection is spread from person to person, usually through sexual contact, including oral, anal, and vaginal sex.  Symptoms vary between males and females. Generally, they include abnormal discharge and burning during urination. Women may also experience painful sex, itching around the vagina, and bleeding between menstrual periods. Men may also experience swelling of the testicles.  This condition is treated with antibiotic medicines. Do not have sex until at least 7 days after completing antibiotic treatment.  If left untreated, gonorrhea can have serious side effects and complications. This information is not intended to replace advice given to you by your health care provider. Make sure you discuss any questions you have with your health care provider. Document Released: 07/01/2000 Document Revised: 08/10/2018 Document Reviewed: 06/03/2016 Elsevier Patient Education  2020 [employer].

## 2019-04-18 ENCOUNTER — Encounter (HOSPITAL_COMMUNITY): Payer: Self-pay | Admitting: Emergency Medicine

## 2019-04-18 ENCOUNTER — Emergency Department (HOSPITAL_COMMUNITY)
Admission: EM | Admit: 2019-04-18 | Discharge: 2019-04-18 | Disposition: A | Discharge status: Home / Self Care | Payer: No Typology Code available for payment source | Attending: Emergency Medicine | Admitting: Emergency Medicine

## 2019-04-18 ENCOUNTER — Other Ambulatory Visit: Payer: Self-pay

## 2019-04-18 DIAGNOSIS — G43809 Other migraine, not intractable, without status migrainosus: Secondary | ICD-10-CM | POA: Insufficient documentation

## 2019-04-18 DIAGNOSIS — R519 Headache, unspecified: Secondary | ICD-10-CM | POA: Diagnosis present

## 2019-04-18 LAB — CBC WITH DIFFERENTIAL/PLATELET
Abs Immature Granulocytes: 0.02 10*3/uL (ref 0.00–0.07)
Basophils Absolute: 0 10*3/uL (ref 0.0–0.1)
Basophils Relative: 0 %
Eosinophils Absolute: 0 10*3/uL (ref 0.0–0.5)
Eosinophils Relative: 0 %
HCT: 36.3 % (ref 36.0–46.0)
Hemoglobin: 11.8 g/dL — ABNORMAL LOW (ref 12.0–15.0)
Immature Granulocytes: 0 %
Lymphocytes Relative: 17 %
Lymphs Abs: 1.3 10*3/uL (ref 0.7–4.0)
MCH: 29.5 pg (ref 26.0–34.0)
MCHC: 32.5 g/dL (ref 30.0–36.0)
MCV: 90.8 fL (ref 80.0–100.0)
Monocytes Absolute: 0.6 10*3/uL (ref 0.1–1.0)
Monocytes Relative: 7 %
Neutro Abs: 6 10*3/uL (ref 1.7–7.7)
Neutrophils Relative %: 76 %
Platelets: 268 10*3/uL (ref 150–400)
RBC: 4 MIL/uL (ref 3.87–5.11)
RDW: 13.8 % (ref 11.5–15.5)
WBC: 8 10*3/uL (ref 4.0–10.5)
nRBC: 0 % (ref 0.0–0.2)

## 2019-04-18 LAB — COMPREHENSIVE METABOLIC PANEL
ALT: 174 U/L — ABNORMAL HIGH (ref 0–44)
AST: 71 U/L — ABNORMAL HIGH (ref 15–41)
Albumin: 3.9 g/dL (ref 3.5–5.0)
Alkaline Phosphatase: 73 U/L (ref 38–126)
Anion gap: 9 (ref 5–15)
BUN: <5 mg/dL — ABNORMAL LOW (ref 6–20)
CO2: 24 mmol/L (ref 22–32)
Calcium: 9.1 mg/dL (ref 8.9–10.3)
Chloride: 106 mmol/L (ref 98–111)
Creatinine, Ser: 0.6 mg/dL (ref 0.44–1.00)
GFR calc Af Amer: >60 mL/min (ref >60)
GFR calc non Af Amer: >60 mL/min (ref >60)
Glucose, Bld: 110 mg/dL — ABNORMAL HIGH (ref 70–99)
Potassium: 3.6 mmol/L (ref 3.5–5.1)
Sodium: 139 mmol/L (ref 135–145)
Total Bilirubin: 0.5 mg/dL (ref 0.3–1.2)
Total Protein: 6.9 g/dL (ref 6.5–8.1)

## 2019-04-18 MED ORDER — SODIUM CHLORIDE 0.9 % IV BOLUS
1000.0000 mL | Freq: Once | INTRAVENOUS | Status: AC
  Administered 2019-04-18: 12:00:00 1000 mL via INTRAVENOUS

## 2019-04-18 MED ORDER — KETOROLAC TROMETHAMINE 30 MG/ML IJ SOLN
30.0000 mg | Freq: Once | INTRAMUSCULAR | Status: AC
  Administered 2019-04-18: 12:00:00 30 mg via INTRAVENOUS
  Filled 2019-04-18: qty 1

## 2019-04-18 MED ORDER — METOCLOPRAMIDE HCL 5 MG/ML IJ SOLN
10.0000 mg | Freq: Once | INTRAMUSCULAR | Status: AC
  Administered 2019-04-18: 10 mg via INTRAVENOUS
  Filled 2019-04-18: qty 2

## 2019-04-18 MED ORDER — DIPHENHYDRAMINE HCL 50 MG/ML IJ SOLN
12.5000 mg | Freq: Once | INTRAMUSCULAR | Status: AC
  Administered 2019-04-18: 12.5 mg via INTRAVENOUS
  Filled 2019-04-18: qty 1

## 2019-04-18 MED ORDER — SUMATRIPTAN SUCCINATE 50 MG PO TABS: 50.0000 mg | ORAL_TABLET | Freq: Once | ORAL | 0 refills | Status: AC | PRN

## 2019-04-18 NOTE — ED Triage Notes (Signed)
Per [facility] pt from home for migraines that started last night around 6pm. Hx migraines.

## 2019-04-18 NOTE — Discharge Instructions (Signed)
Return if any problems.  See your Physician for recheck.  Finish current medications

## 2019-04-18 NOTE — ED Provider Notes (Signed)
[facility] DEPT Provider Note   CSN: [id number] Arrival date & time: 04/18/19  6389     History   Chief Complaint Chief Complaint  Patient presents with  . Migraine    HPI Kristen Hensley is a 28 y.o. female.     The history is provided by the patient. No language interpreter was used.  Migraine This is a recurrent problem. The current episode started 12 to 24 hours ago. The problem occurs constantly. The problem has not changed since onset.Pertinent negatives include no headaches. Nothing aggravates the symptoms. Nothing relieves the symptoms. She has tried nothing for the symptoms.  Pt complains of a headache. Pt was recently hospitalized for evaluation of headaches.  Pt reports no relief from tylenol or ibuprofen.  Pt denies fever or cahills.  Pt reports this headache is typical of her headaches   History reviewed. No pertinent past medical history.  Patient Active Problem List   Diagnosis Date Noted  . Sepsis ([place]) 04/16/2019    Past Surgical History:  Procedure Laterality Date  . BREAST ENHANCEMENT SURGERY       OB History   No obstetric history on file.      Home Medications    Prior to Admission medications   Medication Sig Start Date End Date Taking? Authorizing Provider  cefdinir (OMNICEF) 300 MG capsule Take 1 capsule (300 mg total) by mouth 2 (two) times daily for 3 days. 04/17/19 04/20/19 Yes Lavina Hamman, MD  doxycycline (VIBRA-TABS) 100 MG tablet Take 1 tablet (100 mg total) by mouth 2 (two) times daily for 14 days. 04/17/19 05/01/19 Yes Lavina Hamman, MD  SUMAtriptan (IMITREX) 50 MG tablet Take 1 tablet (50 mg total) by mouth once as needed for migraine. May repeat in 2 hours if headache persists or recurs. 04/18/19 04/18/20  Fransico Meadow, PA-C  tobramycin-dexamethasone [facility]) ophthalmic solution Place 2 drops into the right eye every 4 (four) hours while awake. Patient not taking: Reported on 04/16/2019 02/15/15    Tanna Furry, MD    Family History Family History  Problem Relation Age of Onset  . Diabetes Other   . Hypertension Other   . Stroke Other   . Cancer Other     Social History Social History   Tobacco Use  . Smoking status: Former Smoker    Types: Cigars  . Smokeless tobacco: Never Used  Substance Use Topics  . Alcohol use: Yes    Comment: weekly  . Drug use: No     Allergies   Patient has no known allergies.   Review of Systems Review of Systems  Neurological: Negative for headaches.  All other systems reviewed and are negative.    Physical Exam Updated Vital Signs BP 109/72 (BP Location: Right Arm)   Pulse (!) 54   Temp 98.8 F (37.1 C) (Oral)   Resp 16   Ht 5\' 2"  (1.575 m)   Wt 55.3 kg   LMP 03/20/2019 (Exact Date)   SpO2 97%   BMI 22.31 kg/m   Physical Exam Vitals signs and nursing note reviewed.  Constitutional:      Appearance: She is well-developed.  HENT:     Head: Normocephalic.     Right Ear: Tympanic membrane normal.     Left Ear: Tympanic membrane normal.     Nose: Nose normal.  Eyes:     Extraocular Movements: Extraocular movements intact.     Pupils: Pupils are equal, round, and reactive to light.  Neck:     Musculoskeletal: Normal range of motion.  Cardiovascular:     Rate and Rhythm: Normal rate.     Pulses: Normal pulses.  Pulmonary:     Effort: Pulmonary effort is normal.  Abdominal:     General: Abdomen is flat. There is no distension.  Musculoskeletal: Normal range of motion.  Skin:    General: Skin is warm.  Neurological:     Mental Status: She is alert and oriented to person, place, and time.  Psychiatric:        Mood and Affect: Mood normal.      ED Treatments / Results  Labs (all labs ordered are listed, but only abnormal results are displayed) Labs Reviewed  CBC WITH DIFFERENTIAL/PLATELET - Abnormal; Notable for the following components:      Result Value   Hemoglobin 11.8 (*)    All other components  within normal limits  COMPREHENSIVE METABOLIC PANEL - Abnormal; Notable for the following components:   Glucose, Bld 110 (*)    BUN <5 (*)    AST 71 (*)    ALT 174 (*)    All other components within normal limits    EKG None  Radiology No results found.  Procedures Procedures (including critical care time)  Medications Ordered in ED Medications  sodium chloride 0.9 % bolus 1,000 mL (0 mLs Intravenous Stopped 04/18/19 1319)  ketorolac (TORADOL) 30 MG/ML injection 30 mg (30 mg Intravenous Given 04/18/19 1150)  metoCLOPramide (REGLAN) injection 10 mg (10 mg Intravenous Given 04/18/19 1150)  diphenhydrAMINE (BENADRYL) injection 12.5 mg (12.5 mg Intravenous Given 04/18/19 1150)     Initial Impression / Assessment and Plan / ED Course  I have reviewed the triage vital signs and the nursing notes.  Pertinent labs & imaging results that were available during my care of the patient were reviewed by me and considered in my medical decision making (see chart for details).        MDM   No menigeal signs.  Pt on cefdiner and doxycycline currently.  Pt given toradol, reglan and benadryl.  Pt reports symptoms resolved with medications.  Pt given rx for imitrex.  Pt advised to return if symptoms worsen or change.   Final Clinical Impressions(s) / ED Diagnoses   Final diagnoses:  Other migraine without status migrainosus, not intractable    ED Discharge Orders         Ordered    SUMAtriptan (IMITREX) 50 MG tablet  Once PRN     04/18/19 1403        An After Visit Summary was printed and given to the patient.    Elson Areas, [place] 04/18/19 1425    Tilden Fossa, MD 04/18/19 [phone]

## 2019-04-19 ENCOUNTER — Emergency Department (HOSPITAL_BASED_OUTPATIENT_CLINIC_OR_DEPARTMENT_OTHER): Payer: No Typology Code available for payment source

## 2019-04-19 ENCOUNTER — Emergency Department (HOSPITAL_COMMUNITY): Payer: No Typology Code available for payment source

## 2019-04-19 ENCOUNTER — Encounter (HOSPITAL_BASED_OUTPATIENT_CLINIC_OR_DEPARTMENT_OTHER): Payer: Self-pay | Admitting: Emergency Medicine

## 2019-04-19 ENCOUNTER — Emergency Department (HOSPITAL_BASED_OUTPATIENT_CLINIC_OR_DEPARTMENT_OTHER)
Admission: EM | Admit: 2019-04-19 | Discharge: 2019-04-19 | Disposition: A | Discharge status: Home / Self Care | Payer: No Typology Code available for payment source | Attending: Emergency Medicine | Admitting: Emergency Medicine

## 2019-04-19 DIAGNOSIS — Z20828 Contact with and (suspected) exposure to other viral communicable diseases: Secondary | ICD-10-CM | POA: Diagnosis not present

## 2019-04-19 DIAGNOSIS — G43809 Other migraine, not intractable, without status migrainosus: Secondary | ICD-10-CM | POA: Diagnosis not present

## 2019-04-19 DIAGNOSIS — Z87891 Personal history of nicotine dependence: Secondary | ICD-10-CM | POA: Insufficient documentation

## 2019-04-19 DIAGNOSIS — R1011 Right upper quadrant pain: Secondary | ICD-10-CM | POA: Diagnosis not present

## 2019-04-19 DIAGNOSIS — R198 Other specified symptoms and signs involving the digestive system and abdomen: Secondary | ICD-10-CM

## 2019-04-19 DIAGNOSIS — R1013 Epigastric pain: Secondary | ICD-10-CM | POA: Diagnosis present

## 2019-04-19 HISTORY — DX: Unspecified sexually transmitted disease: A64

## 2019-04-19 HISTORY — DX: Migraine, unspecified, not intractable, without status migrainosus: G43.909

## 2019-04-19 HISTORY — DX: Personal history of other diseases of the female genital tract: Z87.42

## 2019-04-19 HISTORY — DX: Chlamydial infection, unspecified: A74.9

## 2019-04-19 HISTORY — DX: Gonococcal infection, unspecified: A54.9

## 2019-04-19 LAB — CBC WITH DIFFERENTIAL/PLATELET
Abs Immature Granulocytes: 0.02 10*3/uL (ref 0.00–0.07)
Basophils Absolute: 0.1 10*3/uL (ref 0.0–0.1)
Basophils Relative: 1 %
Eosinophils Absolute: 0 10*3/uL (ref 0.0–0.5)
Eosinophils Relative: 1 %
HCT: 36.6 % (ref 36.0–46.0)
Hemoglobin: 11.8 g/dL — ABNORMAL LOW (ref 12.0–15.0)
Immature Granulocytes: 0 %
Lymphocytes Relative: 22 %
Lymphs Abs: 1.8 10*3/uL (ref 0.7–4.0)
MCH: 29.4 pg (ref 26.0–34.0)
MCHC: 32.2 g/dL (ref 30.0–36.0)
MCV: 91 fL (ref 80.0–100.0)
Monocytes Absolute: 0.6 10*3/uL (ref 0.1–1.0)
Monocytes Relative: 7 %
Neutro Abs: 5.7 10*3/uL (ref 1.7–7.7)
Neutrophils Relative %: 69 %
Platelets: 267 10*3/uL (ref 150–400)
RBC: 4.02 MIL/uL (ref 3.87–5.11)
RDW: 13.8 % (ref 11.5–15.5)
WBC: 8.2 10*3/uL (ref 4.0–10.5)
nRBC: 0 % (ref 0.0–0.2)

## 2019-04-19 LAB — COMPREHENSIVE METABOLIC PANEL
ALT: 139 U/L — ABNORMAL HIGH (ref 0–44)
AST: 51 U/L — ABNORMAL HIGH (ref 15–41)
Albumin: 3.5 g/dL (ref 3.5–5.0)
Alkaline Phosphatase: 77 U/L (ref 38–126)
Anion gap: 10 (ref 5–15)
BUN: 6 mg/dL (ref 6–20)
CO2: 25 mmol/L (ref 22–32)
Calcium: 9.1 mg/dL (ref 8.9–10.3)
Chloride: 108 mmol/L (ref 98–111)
Creatinine, Ser: 0.65 mg/dL (ref 0.44–1.00)
GFR calc Af Amer: >60 mL/min (ref >60)
GFR calc non Af Amer: >60 mL/min (ref >60)
Glucose, Bld: 108 mg/dL — ABNORMAL HIGH (ref 70–99)
Potassium: 3.5 mmol/L (ref 3.5–5.1)
Sodium: 143 mmol/L (ref 135–145)
Total Bilirubin: 0.4 mg/dL (ref 0.3–1.2)
Total Protein: 6.5 g/dL (ref 6.5–8.1)

## 2019-04-19 LAB — CSF CULTURE W GRAM STAIN: Culture: NO GROWTH

## 2019-04-19 LAB — LIPASE, BLOOD: Lipase: 19 U/L (ref 11–51)

## 2019-04-19 MED ORDER — LIDOCAINE VISCOUS HCL 2 % MT SOLN
15.0000 mL | Freq: Once | OROMUCOSAL | Status: AC
  Administered 2019-04-19: 10:00:00 15 mL via ORAL
  Filled 2019-04-19: qty 15

## 2019-04-19 MED ORDER — TECHNETIUM TC 99M MEBROFENIN IV KIT
4.9000 | PACK | Freq: Once | INTRAVENOUS | Status: AC
  Administered 2019-04-19: 14:00:00 4.9 via INTRAVENOUS

## 2019-04-19 MED ORDER — MORPHINE SULFATE (PF) 4 MG/ML IV SOLN
4.0000 mg | Freq: Once | INTRAVENOUS | Status: AC
  Administered 2019-04-19: 20:00:00 4 mg via INTRAVENOUS
  Filled 2019-04-19: qty 1

## 2019-04-19 MED ORDER — PROMETHAZINE HCL 25 MG/ML IJ SOLN
12.5000 mg | Freq: Once | INTRAMUSCULAR | Status: AC
  Administered 2019-04-19: 16:00:00 12.5 mg via INTRAVENOUS
  Filled 2019-04-19: qty 1

## 2019-04-19 MED ORDER — ONDANSETRON HCL 4 MG/2ML IJ SOLN
4.0000 mg | Freq: Four times a day (QID) | INTRAMUSCULAR | Status: DC | PRN
  Administered 2019-04-19 (×2): 4 mg via INTRAVENOUS
  Filled 2019-04-19 (×2): qty 2

## 2019-04-19 MED ORDER — MORPHINE SULFATE (PF) 4 MG/ML IV SOLN: 4.0000 mg | Freq: Once | INTRAVENOUS | Status: DC

## 2019-04-19 MED ORDER — ONDANSETRON 4 MG PO TBDP: 4.0000 mg | ORAL_TABLET | Freq: Three times a day (TID) | ORAL | 0 refills | Status: AC | PRN

## 2019-04-19 MED ORDER — SODIUM CHLORIDE 0.9 % IV BOLUS
1000.0000 mL | Freq: Once | INTRAVENOUS | Status: AC
  Administered 2019-04-19: 16:00:00 1000 mL via INTRAVENOUS

## 2019-04-19 MED ORDER — FAMOTIDINE IN NACL 20-0.9 MG/50ML-% IV SOLN
20.0000 mg | Freq: Once | INTRAVENOUS | Status: AC
  Administered 2019-04-19: 10:00:00 20 mg via INTRAVENOUS
  Filled 2019-04-19: qty 50

## 2019-04-19 MED ORDER — ALUM & MAG HYDROXIDE-SIMETH 200-200-20 MG/5ML PO SUSP
30.0000 mL | Freq: Once | ORAL | Status: AC
  Administered 2019-04-19: 30 mL via ORAL
  Filled 2019-04-19: qty 30

## 2019-04-19 NOTE — ED Notes (Signed)
ED Provider at bedside. 

## 2019-04-19 NOTE — ED Notes (Signed)
Patient transported to CT 

## 2019-04-19 NOTE — Discharge Summary (Signed)
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 Headache Hypertension Active Problems:   Sepsis (HCC)   Admitted From: Home Disposition:  Home   Recommendations for Outpatient Follow-up:  1. PCP: Follow-up for resolution of PID symptoms 2. Follow up LABS/TEST: None  Follow-up Information    PCP. Schedule an appointment as soon as possible for a visit in 1 week(s).          Diet recommendation: Regular diet  Activity: The patient is advised to gradually reintroduce usual activities,as tolerated .  Discharge Condition: good  Code Status: Full code   History of present illness: As per the H and P dictated on admission, "Kristen SheetsMary A Dieujuste is a 28 y.o. female with Past medical history of recurrent STDs. Patient presents with complaints of back pain neck pain and joint pain. He also reports severe headache on admission. She had 2-day history of this along with fever. No abdominal pain no nausea no vomiting. Patient was recently seen in the ER at which time.  Gonococcal and Chlamydia probe performed which were positive.  Patient had a temp of 100.4 at home.  She reports that she had a headache that would not improve. At the time of my evaluation patient reports that the only complaint right now is back pain where she had LP.  No nausea no vomiting no headache no focal deficit. She still has some neck pain but no neck stiffness. No diarrhea no constipation at home. No burning urination."  Hospital Course:  Summary of her active problems in the hospital is as following. 1.  Sepsis. Likely PID. Concern for meningitis-ruled out Reported positive GC chlamydia probe although report not available in epic. Presents with complaints of back pain neck pain fever. There was initial concern for  chronic meningitis. CT head unremarkable. Lumbar picture performed.  CSF analysis shows no evidence of acute infection.  No pleocytosis. Patient's symptoms have improved. Cryptococcal antigen negative.  No evidence of encephalitis. We will discontinue antibiotics for meningitis coverage. Patient was treated with IV ceftriaxone and oral doxycycline. Ultrasound pelvis with transabdominal shows normal uterus normal ovaries without any acute abnormality. Patient does not have any further discharge at the time of the discharge from the hospital. Patient did have some low blood pressure and was given IV fluid hydration. Orthostatic vitals were negative. At present we will discharge the patient home on oral doxycycline as well as cefdinir to cover her for infection. Patient should follow-up with PCP in 1 week and she already has an appointment with OB/GYN. Patient mentions that her partner has been treated with antibiotics as well.  2.  Frequent STD Negative HIV, hep B, hep C. Recommend safe sex.  3.  Headache. PRN Fioricet in the hospital.  Resolved  Patient was ambulatory without any assistance. On the day of the discharge the patient's vitals were stable, and no other acute medical condition were reported by patient. the patient was felt safe to be discharge at Home with no therapy needed on discharge.  Consultants: none Procedures: none  DISCHARGE MEDICATION: Allergies as of 04/17/2019   No Known Allergies     Medication List    STOP taking these medications   metroNIDAZOLE 0.75 % vaginal gel Commonly known as: METROGEL VAGINAL     TAKE these medications  cefdinir 300 MG capsule Commonly known as: OMNICEF Take 1 capsule (300 mg total) by mouth 2 (two) times daily for 3 days.   doxycycline 100 MG tablet Commonly known as: VIBRA-TABS Take 1 tablet (100 mg total) by mouth 2 (two) times daily for 14 days.   tobramycin-dexamethasone ophthalmic solution Commonly known as:  TobraDex Place 2 drops into the right eye every 4 (four) hours while awake.      No Known Allergies Discharge Instructions    Diet - low sodium heart healthy   Complete by: As directed    Increase activity slowly   Complete by: As directed      Discharge Exam: Filed Weights   04/16/19 1216 04/17/19 0525  Weight: 55 kg 58.3 kg   Vitals:   04/17/19 0643 04/17/19 1303  BP: 105/66 109/82  Pulse: 71 (!) 57  Resp:  18  Temp:  98.7 F (37.1 C)  SpO2: 98% 100%   General: Appear in no distress, no Rash; Oral Mucosa Clear, moist. no Abnormal Mass Or lumps Cardiovascular: S1 and S2 Present, no Murmur, Respiratory: normal respiratory effort, Bilateral Air entry present and Clear to Auscultation, no Crackles, no wheezes Abdomen: Bowel Sound present, Soft and no tenderness, no hernia Extremities: no Pedal edema, no calf tenderness Neurology: alert and oriented to time, place, and person affect appropriate.  The results of significant diagnostics from this hospitalization (including imaging, microbiology, ancillary and laboratory) are listed below for reference.    Significant Diagnostic Studies: Ct Head Wo Contrast  Result Date: 04/15/2019 CLINICAL DATA:  Headache with vomiting EXAM: CT HEAD WITHOUT CONTRAST TECHNIQUE: Contiguous axial images were obtained from the base of the skull through the vertex without intravenous contrast. COMPARISON:  None. FINDINGS: Brain: No evidence of acute infarction, hemorrhage, hydrocephalus, extra-axial collection or mass lesion/mass effect. Vascular: No hyperdense vessel or unexpected calcification. Skull: Normal. Negative for fracture or focal lesion. Sinuses/Orbits: No acute finding. Other: None IMPRESSION: Negative non contrasted CT appearance of the brain Electronically Signed   By: Jasmine Pang M.D.   On: 04/15/2019 22:55   US Pelvic Complete With Transvaginal  Result Date: 04/16/2019 CLINICAL DATA:  Pelvic inflammatory disease. EXAM:  TRANSABDOMINAL AND TRANSVAGINAL ULTRASOUND OF PELVIS TECHNIQUE: Both transabdominal and transvaginal ultrasound examinations of the pelvis were performed. Transabdominal technique was performed for global imaging of the pelvis including uterus, ovaries, adnexal regions, and pelvic cul-de-sac. It was necessary to proceed with endovaginal exam following the transabdominal exam to visualize the ovaries and endometrium. COMPARISON:  None FINDINGS: Uterus Measurements: 7.8 x 4.2 x 5.2 cm = volume: 88 mL. No fibroids or other mass visualized. Endometrium Thickness: 3.0.  No focal abnormality visualized. Right ovary Measurements: 3.5 x 2.6 x 1.9 cm = volume: 8.9 mL. Normal appearance/no adnexal mass. Left ovary Measurements: 3.6 x 1.6 x 2.3 cm = volume: 5.9 mL. 1.8 x 1.9 x 1.9 cm complex cyst, likely hemorrhagic. Other findings Small amount of free pelvic fluid. IMPRESSION: 1. Normal sonographic appearance of the uterus. 2. Normal right ovary. 3. Small complex cyst associated with the left ovary, likely a hemorrhagic or collapsing cyst. 4. Small amount of free pelvic fluid. Electronically Signed   By: Rudie Meyer M.D.   On: 04/16/2019 12:42    Microbiology: Recent Results (from the past 240 hour(s))  Wet prep, genital     Status: Abnormal   Collection Time: 04/10/19 11:27 PM   Specimen: Cervix  Result Value Ref Range Status   Yeast Wet Prep HPF POC  NONE SEEN NONE SEEN Final   Trich, Wet Prep NONE SEEN NONE SEEN Final   Clue Cells Wet Prep HPF POC PRESENT (A) NONE SEEN Final   WBC, Wet Prep HPF POC FEW (A) NONE SEEN Final   Sperm NONE SEEN  Final    Comment: Performed at [facility], [address] [place], [place] [address]  Culture, blood (Routine X 2) w Reflex to ID Panel     Status: None (Preliminary result)   Collection Time: 04/15/19 10:20 PM   Specimen: BLOOD  Result Value Ref Range Status   Specimen Description   Final    BLOOD RIGHT ANTECUBITAL Performed at [facility],  [address] [place], [place] [address]    Special Requests   Final    BOTTLES DRAWN AEROBIC AND ANAEROBIC Blood Culture adequate volume Performed at [facility], [address] [place], [place] [address]    Culture   Final    NO GROWTH 3 DAYS Performed at [facility], [address], [place], [place] [address]    Report Status PENDING  Incomplete  Culture, blood (Routine X 2) w Reflex to ID Panel     Status: None (Preliminary result)   Collection Time: 04/15/19 10:35 PM   Specimen: BLOOD LEFT ARM  Result Value Ref Range Status   Specimen Description   Final    BLOOD LEFT ARM Performed at [facility], [address] [place], [place] [address]    Special Requests   Final    BOTTLES DRAWN AEROBIC AND ANAEROBIC Blood Culture adequate volume Performed at [facility], [address] [place], [place] [address]    Culture   Final    NO GROWTH 3 DAYS Performed at [facility], [address], [place], [place] [address]    Report Status PENDING  Incomplete  CSF culture     Status: None (Preliminary result)   Collection Time: 04/15/19 11:36 PM   Specimen: Back; Cerebrospinal Fluid  Result Value Ref Range Status   Specimen Description   Final    BACK Performed at [facility], [address] [place], [place] [address]    Special Requests   Final    NONE Performed at [facility], [address] [place], [place] [address]    Gram Stain CYTOSPIN SMEAR NO WBC SEEN NO ORGANISMS SEEN   Final   Culture   Final    NO GROWTH 2 DAYS Performed at [facility], [address], [place], [place] [address]    Report Status PENDING  Incomplete  SARS Coronavirus 2 [facility] order, Performed in [facility] hospital lab) Nasopharyngeal Nasopharyngeal Swab     Status: None   Collection Time: 04/16/19 12:19 AM   Specimen: Nasopharyngeal Swab  Result Value Ref Range Status   SARS Coronavirus 2 NEGATIVE  NEGATIVE Final    Comment: (NOTE) If result is NEGATIVE SARS-CoV-2 target nucleic acids are NOT DETECTED. The SARS-CoV-2 RNA is generally detectable in upper and lower  respiratory specimens during the acute phase of infection. The lowest  concentration of SARS-CoV-2 viral copies this assay can detect is 250  copies / mL. A negative result does not preclude SARS-CoV-2 infection  and should not be used as the sole basis for treatment or other  patient management decisions.  A negative result may occur with  improper specimen  collection / handling, submission of specimen other  than nasopharyngeal swab, presence of viral mutation(s) within the  areas targeted by this assay, and inadequate number of viral copies  (<250 copies / mL). A negative result must be combined with clinical  observations, patient history, and epidemiological information. If result is POSITIVE SARS-CoV-2 target nucleic acids are DETECTED. The SARS-CoV-2 RNA is generally detectable in upper and lower  respiratory specimens dur ing the acute phase of infection.  Positive  results are indicative of active infection with SARS-CoV-2.  Clinical  correlation with patient history and other diagnostic information is  necessary to determine patient infection status.  Positive results do  not rule out bacterial infection or co-infection with other viruses. If result is PRESUMPTIVE POSTIVE SARS-CoV-2 nucleic acids MAY BE PRESENT.   A presumptive positive result was obtained on the submitted specimen  and confirmed on repeat testing.  While 2019 novel coronavirus  (SARS-CoV-2) nucleic acids may be present in the submitted sample  additional confirmatory testing may be necessary for epidemiological  and / or clinical management purposes  to differentiate between  SARS-CoV-2 and other Sarbecovirus currently known to infect humans.  If clinically indicated additional testing with an alternate test  methodology [id number]) is  advised. The SARS-CoV-2 RNA is generally  detectable in upper and lower respiratory sp ecimens during the acute  phase of infection. The expected result is Negative. Fact Sheet for Patients:  StrictlyIdeas.no Fact Sheet for Healthcare Providers: BankingDealers.co.za This test is not yet approved or cleared by the [place] FDA and has been authorized for detection and/or diagnosis of SARS-CoV-2 by FDA under an Emergency Use Authorization (EUA).  This EUA will remain in effect (meaning this test can be used) for the duration of the COVID-19 declaration under Section 564(b)(1) of the Act, 21 U.S.C. section 360bbb-3(b)(1), unless the authorization is terminated or revoked sooner. Performed at [facility], [address] [place], [place] [address]      Labs: CBC: Recent Labs  Lab 04/15/19 2222 04/17/19 0649 04/18/19 1152  WBC 12.7* 8.2 8.0  NEUTROABS  --   --  6.0  HGB 12.8 10.5* 11.8*  HCT 39.6 32.5* 36.3  MCV 91.2 91.3 90.8  PLT 236 214 784   Basic Metabolic Panel: Recent Labs  Lab 04/15/19 2222 04/17/19 0649 04/18/19 1152  NA 136 140 139  K 3.4* 3.5 3.6  CL 100 111 106  CO2 22 20* 24  GLUCOSE 117* 107* 110*  BUN 9 <5* <5*  CREATININE 0.75 0.49 0.60  CALCIUM 9.4 8.2* 9.1   Liver Function Tests: Recent Labs  Lab 04/15/19 2222 04/17/19 0649 04/18/19 1152  AST 21 171* 71*  ALT 15 206* 174*  ALKPHOS 54 68 73  BILITOT 0.7 0.6 0.5  PROT 8.2* 5.9* 6.9  ALBUMIN 4.7 3.3* 3.9   No results for input(s): LIPASE, AMYLASE in the last 168 hours. No results for input(s): AMMONIA in the last 168 hours. Cardiac Enzymes: No results for input(s): CKTOTAL, CKMB, CKMBINDEX, TROPONINI in the last 168 hours. BNP (last 3 results) No results for input(s): BNP in the last 8760 hours. CBG: No results for input(s): GLUCAP in the last 168 hours.  Time spent: 35 minutes  Signed:  Berle Mull  [facility]  04/17/2019  9:17 AM

## 2019-04-19 NOTE — ED Triage Notes (Signed)
Epigastric pain since yesterday

## 2019-04-19 NOTE — ED Notes (Signed)
Pt back from CT

## 2019-04-19 NOTE — ED Notes (Signed)
Report called to Susie RN, charge nurse at [facility] ED

## 2019-04-19 NOTE — Consult Note (Signed)
Kristen Hensley Nov 19, 1990  [id number].    Requesting MD: Dr. Jacalyn Lefevre Chief Complaint/Reason for Consult: abdominal pain  HPI:  This is a 28 yo black female who was admitted for observation on Monday of this week secondary to presumed PID as she tested positive for GC/Chlamydia.  She was also noted to have a bad headache along with back pain.  She underwent a CT of the head that was negative along with an LP that was negative as well.  Her pelvic US was relatively unremarkable.  Her COVID at that time was negative as well.  She was discharged later on Cefdinir and doxycycline.  She then presented back to the ED yesterday secondary to a severe HA again.  She was give a migraine cocktail as well as a Rx for Imitrex.    Apparently, 2 hours after being discharged from the ED yesterday she then developed upper abdominal pain.  The only thing she has eaten since then was some yogurt this morning.  She then took all of her medicines and then threw up after this.  The pain does not radiate anywhere.  She still has her headache.  She hasn't really tried anything to make it better.  She does states movement makes her pain worse.  She denies any fevers.  She has had some diarrhea since starting her oral abx earlier in the week.  She represented to the ED again today for this complaint.  She underwent an [place] that revealed a thickened gallbladder wall with some minimal pericholecystic fluid; however, she did not have any gallstones noted.  Her LFTs were actually somewhat elevated when she was here several days ago and are improved today.  Her WBC is normal at 8.  We have been asked to see her for further recommendations.  ROS: ROS: Please see HPI, otherwise all other systems have been reviewed and are negative  Family History  Problem Relation Age of Onset   Diabetes Other    Hypertension Other    Stroke Other    Cancer Other     Past Medical History:  Diagnosis Date   Chlamydia     Gonorrhea    History of acute PID    Migraine    STD (female)     Past Surgical History:  Procedure Laterality Date   BREAST ENHANCEMENT SURGERY      Social History:  reports that she has quit smoking. Her smoking use included cigars. She has never used smokeless tobacco. She reports current alcohol use. She reports that she does not use drugs.  Allergies: No Known Allergies  (Not in a hospital admission)    Physical Exam: Blood pressure 115/66, pulse (!) 53, temperature 98.3 F (36.8 C), temperature source Oral, resp. rate 18, last menstrual period 04/18/2019, SpO2 98 %. General: WD, WN black female who is laying in bed in NAD HEENT: head is normocephalic, atraumatic.  Sclera are noninjected.  PERRL.  Ears and nose without any masses or lesions.  Mouth is pink and moist Heart: regular, rate, and rhythm.  Normal s1,s2. No obvious murmurs, gallops, or rubs noted.  Palpable radial and pedal pulses bilaterally Lungs: CTAB, no wheezes, rhonchi, or rales noted.  Respiratory effort nonlabored Abd: soft, mildly tender in RUQ and mildly tender in lower abdomen (states its tender because she is on her period), ND, +BS, no masses, hernias, or organomegaly MS: all 4 extremities are symmetrical with no cyanosis, clubbing, or edema. Skin: warm and dry  with no masses, lesions, or rashes Psych: A&Ox3 with an appropriate affect.   Results for orders placed or performed during the hospital encounter of 04/19/19 (from the past 48 hour(s))  Comprehensive metabolic panel     Status: Abnormal   Collection Time: 04/19/19  9:32 AM  Result Value Ref Range   Sodium 143 135 - 145 mmol/L   Potassium 3.5 3.5 - 5.1 mmol/L   Chloride 108 98 - 111 mmol/L   CO2 25 22 - 32 mmol/L   Glucose, Bld 108 (H) 70 - 99 mg/dL   BUN 6 6 - 20 mg/dL   Creatinine, Ser 0.65 0.44 - 1.00 mg/dL   Calcium 9.1 8.9 - 10.3 mg/dL   Total Protein 6.5 6.5 - 8.1 g/dL   Albumin 3.5 3.5 - 5.0 g/dL   AST 51 (H) 15 - 41 U/L    ALT 139 (H) 0 - 44 U/L   Alkaline Phosphatase 77 38 - 126 U/L   Total Bilirubin 0.4 0.3 - 1.2 mg/dL   GFR calc non Af Amer >60 >60 mL/min   GFR calc Af Amer >60 >60 mL/min   Anion gap 10 5 - 15    Comment: Performed at [facility], [address] [place], [place] [address]  Lipase, blood     Status: None   Collection Time: 04/19/19  9:32 AM  Result Value Ref Range   Lipase 19 11 - 51 U/L    Comment: Performed at [facility] [address] [place], [place] [address]  CBC with Differential     Status: Abnormal   Collection Time: 04/19/19  9:32 AM  Result Value Ref Range   WBC 8.2 4.0 - 10.5 K/uL   RBC 4.02 3.87 - 5.11 MIL/uL   Hemoglobin 11.8 (L) 12.0 - 15.0 g/dL   HCT 36.6 36.0 - 46.0 %   MCV 91.0 80.0 - 100.0 fL   MCH 29.4 26.0 - 34.0 pg   MCHC 32.2 30.0 - 36.0 g/dL   RDW 13.8 11.5 - 15.5 %   Platelets 267 150 - 400 K/uL   nRBC 0.0 0.0 - 0.2 %   Neutrophils Relative % 69 %   Neutro Abs 5.7 1.7 - 7.7 K/uL   Lymphocytes Relative 22 %   Lymphs Abs 1.8 0.7 - 4.0 K/uL   Monocytes Relative 7 %   Monocytes Absolute 0.6 0.1 - 1.0 K/uL   Eosinophils Relative 1 %   Eosinophils Absolute 0.0 0.0 - 0.5 K/uL   Basophils Relative 1 %   Basophils Absolute 0.1 0.0 - 0.1 K/uL   Immature Granulocytes 0 %   Abs Immature Granulocytes 0.02 0.00 - 0.07 K/uL    Comment: Performed at [facility], [address] [place], [place] [address]   US Abdomen Limited Ruq  Result Date: 04/19/2019 CLINICAL DATA:  Epigastric pain beginning yesterday. Recent hospitalization for sepsis. EXAM: ULTRASOUND ABDOMEN LIMITED RIGHT UPPER QUADRANT COMPARISON:  None. FINDINGS: Gallbladder: Gallbladder is nondilated, and no gallstones are seen. Diffuse gallbladder wall thickening is seen measuring up to 10 mm. Small amount of pericholecystic fluid is also seen. No sonographic Murphy sign noted by sonographer. Common bile duct: Diameter: 3 mm, within normal limits. Liver: No focal  lesion identified. Within normal limits in parenchymal echogenicity. Portal vein is patent on color Doppler imaging with normal direction of blood flow towards the liver. Other: Right pleural effusion. IMPRESSION: Marked diffuse gallbladder wall thickening and small amount  of pericholecystic fluid. No gallstones or definite sonographic Murphy sign. These findings are nonspecific, and acalculous cholecystitis cannot be excluded. Consider nuclear medicine HIDA scan for further evaluation. No evidence of biliary ductal dilatation. Right pleural effusion. Electronically Signed   By: Danae OrleansJohn A Stahl M.D.   On: 04/19/2019 11:46      Assessment/Plan HA/Migraine GC/Chlamydia Recent admission for PID  Abdominal pain The patient has a constellation of presenting symptoms over this week.  It is unclear if these are all related or totally separate.  Her abdominal pain is somewhat vague and difficult to tell what makes it better or worse and to really get a good history.  However, she does have an US that suggests some gb wall thickening and mild pericholecystic fluid.  She however, does have not gallstones and it would be atypical to have an otherwise healthy young person with acalculous cholecystitis.  It would also be odd to develop cholecystitis while on multiple antibiotics.  Nonetheless, to be determine what is going on, we will obtain a HIDA scan.  Given her recent diagnosis of PID, she could have Fitz-Hugh-Curtis syndrome causing some of her RUQ abdominal pain.  If her HIDA scan is negative, we will defer to ED for further work up and evaluation such as a CT scan, etc. If her HIDA is positive then we can discuss abx therapy vs cholecystectomy.   Letha CapeKelly E Xariah Silvernail, [facility] 04/19/2019, 3:57 PM Pager: [phone]

## 2019-04-19 NOTE — ED Provider Notes (Signed)
[facility] EMERGENCY DEPARTMENT Provider Note   CSN: [id number] Arrival date & time: 04/19/19  0815     History   Chief Complaint Chief Complaint  Patient presents with  . Abdominal Pain    HPI Kristen Hensley is a 28 y.o. female with a history of PID, discharge from the hospital 5 days ago on cefdinir (end date 10/3) and doxycycline (end date 10/14), presenting to the emergency department epigastric pain.  She reports she has had stabbing burning epigastric pain for the past 2 or 3 days.  She believes is directly associated with taking her medications.  She says every time she takes her pills she develops this pain within half an hour and has vomited in the past.  She states that eating and drinking water otherwise has not bothered her.  She describes the pain is constant and severe since yesterday.  It is located in her epigastrium and does not radiate anywhere.  She denies any fevers or chills.  She reports loose bowel movements that are nonbloody daily.  She denies any continued vaginal discharge.  The patient was seen here yesterday for headache which improved and she states is almost completely resolved with the IV medication she received at that time.  She reports she is taking 1 or 2 doses of ibuprofen but does not take it habitually or in large doses.  She reports no known drug allergies.  Of note, the patient reports that she was hospitalized for "sepsis" secondary to PID.  Her blood cultures have resulted as negative.  She did test positive for chlamydia and gonorrhea on September 23, at which time she was admitted to the hospital.  While in the hospital she also underwent lumbar puncture for meningitis rule out, with a negative CSF cultures.  She reports she has had an ongoing headache since the puncture.  She was also tested negative for COVID on 929.     HPI  Past Medical History:  Diagnosis Date  . Chlamydia   . Gonorrhea   . History of acute PID   .  Migraine   . STD (female)     Patient Active Problem List   Diagnosis Date Noted  . Sepsis (HCC) 04/16/2019    Past Surgical History:  Procedure Laterality Date  . BREAST ENHANCEMENT SURGERY       OB History   No obstetric history on file.      Home Medications    Prior to Admission medications   Medication Sig Start Date End Date Taking? Authorizing Provider  SUMAtriptan (IMITREX) 50 MG tablet Take 1 tablet (50 mg total) by mouth once as needed for migraine. May repeat in 2 hours if headache persists or recurs. 04/18/19 04/18/20 Yes Elson AreasSofia, [place] K, PA-C  cefdinir (OMNICEF) 300 MG capsule Take 1 capsule (300 mg total) by mouth 2 (two) times daily for 3 days. Patient not taking: Reported on 04/19/2019 04/17/19 04/20/19  Rolly SalterPatel, Pranav M, MD  doxycycline (VIBRA-TABS) 100 MG tablet Take 1 tablet (100 mg total) by mouth 2 (two) times daily for 14 days. Patient not taking: Reported on 04/19/2019 04/17/19 05/01/19  Rolly SalterPatel, Pranav M, MD  tobramycin-dexamethasone [facility](TOBRADEX) ophthalmic solution Place 2 drops into the right eye every 4 (four) hours while awake. Patient not taking: Reported on 04/16/2019 02/15/15   Rolland PorterJames, Mark, MD    Family History Family History  Problem Relation Age of Onset  . Diabetes Other   . Hypertension Other   . Stroke Other   .  Cancer Other     Social History Social History   Tobacco Use  . Smoking status: Former Smoker    Types: Cigars  . Smokeless tobacco: Never Used  Substance Use Topics  . Alcohol use: Yes    Comment: weekly  . Drug use: No     Allergies   Patient has no known allergies.   Review of Systems Review of Systems  Constitutional: Negative for chills and fever.  Respiratory: Negative for cough and shortness of breath.   Cardiovascular: Negative for chest pain and palpitations.  Gastrointestinal: Positive for abdominal pain, diarrhea, nausea and vomiting.  Genitourinary: Negative for dysuria, hematuria, vaginal bleeding, vaginal  discharge and vaginal pain.  Skin: Negative for pallor and rash.  Neurological: Positive for headaches. Negative for syncope and light-headedness.  All other systems reviewed and are negative.    Physical Exam Updated Vital Signs BP 115/66 (BP Location: Right Arm)   Pulse (!) 53   Temp 98.3 F (36.8 C) (Oral)   Resp 18   LMP 04/18/2019   SpO2 98%   Physical Exam Vitals signs and nursing note reviewed.  Constitutional:      General: She is not in acute distress.    Appearance: She is well-developed.  HENT:     Head: Normocephalic and atraumatic.     Comments: Neck is supple Eyes:     Extraocular Movements: Extraocular movements intact.     Conjunctiva/sclera: Conjunctivae normal.     Pupils: Pupils are equal, round, and reactive to light.     Comments: No photophobia  Neck:     Musculoskeletal: Neck supple.  Cardiovascular:     Rate and Rhythm: Normal rate and regular rhythm.     Heart sounds: No murmur.  Pulmonary:     Effort: Pulmonary effort is normal. No respiratory distress.     Breath sounds: Normal breath sounds.  Abdominal:     Palpations: Abdomen is soft.     Tenderness: There is abdominal tenderness in the right upper quadrant and epigastric area. There is no guarding or rebound. Positive signs include Murphy's sign. Negative signs include McBurney's sign.  Skin:    General: Skin is warm and dry.  Neurological:     General: No focal deficit present.     Mental Status: She is alert and oriented to person, place, and time.      ED Treatments / Results  Labs (all labs ordered are listed, but only abnormal results are displayed) Labs Reviewed  COMPREHENSIVE METABOLIC PANEL - Abnormal; Notable for the following components:      Result Value   Glucose, Bld 108 (*)    AST 51 (*)    ALT 139 (*)    All other components within normal limits  CBC WITH DIFFERENTIAL/PLATELET - Abnormal; Notable for the following components:   Hemoglobin 11.8 (*)    All other  components within normal limits  SARS CORONAVIRUS 2 (TAT 6-24 HRS)  LIPASE, BLOOD    EKG None  Radiology US Abdomen Limited Ruq  Result Date: 04/19/2019 CLINICAL DATA:  Epigastric pain beginning yesterday. Recent hospitalization for sepsis. EXAM: ULTRASOUND ABDOMEN LIMITED RIGHT UPPER QUADRANT COMPARISON:  None. FINDINGS: Gallbladder: Gallbladder is nondilated, and no gallstones are seen. Diffuse gallbladder wall thickening is seen measuring up to 10 mm. Small amount of pericholecystic fluid is also seen. No sonographic Murphy sign noted by sonographer. Common bile duct: Diameter: 3 mm, within normal limits. Liver: No focal lesion identified. Within normal limits in parenchymal  echogenicity. Portal vein is patent on color Doppler imaging with normal direction of blood flow towards the liver. Other: Right pleural effusion. IMPRESSION: Marked diffuse gallbladder wall thickening and small amount of pericholecystic fluid. No gallstones or definite sonographic Murphy sign. These findings are nonspecific, and acalculous cholecystitis cannot be excluded. Consider nuclear medicine HIDA scan for further evaluation. No evidence of biliary ductal dilatation. Right pleural effusion. Electronically Signed   By: Danae Orleans M.D.   On: 04/19/2019 11:46    Procedures Procedures (including critical care time)  Medications Ordered in ED Medications  ondansetron (ZOFRAN) injection 4 mg (4 mg Intravenous Given 04/19/19 1400)  morphine 4 MG/ML injection 4 mg (has no administration in time range)  famotidine (PEPCID) IVPB 20 mg premix (0 mg Intravenous Stopped 04/19/19 1004)  alum & mag hydroxide-simeth (MAALOX/MYLANTA) 200-200-20 MG/5ML suspension 30 mL (30 mLs Oral Given 04/19/19 0936)    And  lidocaine (XYLOCAINE) 2 % viscous mouth solution 15 mL (15 mLs Oral Given 04/19/19 0936)  sodium chloride 0.9 % bolus 1,000 mL (1,000 mLs Intravenous New Bag/Given 04/19/19 1552)  promethazine (PHENERGAN) injection 12.5 mg  (12.5 mg Intravenous Given 04/19/19 1553)  technetium TC 9M mebrofenin (CHOLETEC) injection 4.9 millicurie (4.9 millicuries Intravenous Contrast Given 04/19/19 1400)     Initial Impression / Assessment and Plan / ED Course  I have reviewed the triage vital signs and the nursing notes.  Pertinent labs & imaging results that were available during my care of the patient were reviewed by me and considered in my medical decision making (see chart for details).  28 year old female presented to the emergency department with worsening epigastric and right upper quadrant pain for the past day and a half.  She reports she has been having some epigastric discomfort and diarrhea and loose bowel movements since being sent home on her antibiotics 5 days ago.  On my exam she appears mildly uncomfortable and does demonstrate some mild right upper quadrant tenderness as well as epigastric tenderness.  She does have a positive sonographic Murphy sign.  Yesterday in the emergency department she demonstrated some mild elevations in her LFTs.  I believe it is reasonable to recheck these, as well as obtain a right upper quadrant ultrasound to evaluate for gallbladder disease, as this has not been done in the past.  If this is unremarkable, I suspect her symptoms are most likely related to gastritis which may be medication induced.  She only has 2 more days of cefdinir.  She does have an extended course of doxycycline.  I will search for alternative if there is one.  The rest of her abdominal exam is benign.  I have a low suspicion for appendicitis or other acute infectious intra-abdominal process.  In terms of her headache, this appears to be improving since her treatment yesterday.  I suspect this was a post LP type headache.  I have a very low suspicion for meningitis at this time.  Clinical Course as of Apr 18 1909  Fri Apr 19, 2019  1032 LFTs improving from yesterday.   [MT]  1159 IMPRESSION: Marked diffuse  gallbladder wall thickening and small amount of pericholecystic fluid. No gallstones or definite sonographic Murphy sign. These findings are nonspecific, and acalculous cholecystitis cannot be excluded. Consider nuclear medicine HIDA scan for further evaluation.  No evidence of biliary ductal dilatation.  Right pleural effusion.   [MT]  1331 I spoke to Gaynelle Adu of general surgery who recommends ED to ED transfer to [facility],  where they will see the patient in consultation   [MT]  1339 I spoke to Dr. Jacalyn Lefevre the ED attending at [facility] who accepted the patient for ED to Ed transfer   [MT]  1344 Patient instructed that she will be discharged for POV transfer to [facility].  Her father is at bedside.  Advised them to go straight to the emergency department.  The IV was left in place this was discussed with the patient.  I do believe that she is well-appearing and stable for transfer at this time and is safe to go in a private vehicle.   [MT]    Clinical Course User Index [MT] Renaye Rakers Kermit Balo, MD     Final Clinical Impressions(s) / ED Diagnoses   Final diagnoses:  Positive Murphy's Sign  Right upper quadrant abdominal pain  Acalculous cholecystitis    ED Discharge Orders    None       Terald Sleeper, MD 04/19/19 1911

## 2019-04-19 NOTE — ED Notes (Signed)
Awaiting US.

## 2019-04-19 NOTE — ED Notes (Signed)
Family at bedside. 

## 2019-04-19 NOTE — Discharge Instructions (Addendum)
Return if your abdominal pain returns.

## 2019-04-19 NOTE — ED Notes (Signed)
US at bedside

## 2019-04-19 NOTE — Plan of Care (Signed)
Case discussed with staff at [facility].  Patient with epigastric pain, elevated LFTs, and U/S showing no obvious gallstones but thickening GB wall.  Recent hospitalization, for sepsis per chart.   Bile duct non-dilated.  Suggested they reach out to surgeons.  Might consider HIDA scan, but defer to surgeons.  We in [facility] are happy to see patient, upon request, once she is transferred to [facility].  Otherwise, if case deemed appropriate for cholecystectomy by surgeons, would pursue cholecystectomy + IOC.   I do not think patient needs MRCP at this time.

## 2019-04-19 NOTE — ED Provider Notes (Signed)
Pt transferred from [facility] to see surgery.  I spoke with surgery.  They requested a HIDA scan.  If that is nl, then they do not need to get involved.  Pt denies current abd pain, unless her abdomen is palpated.  She still has a headache and feels nauseous.    HIDA scan:  IMPRESSION: 1. Negative for acute gallbladder disease. 2. Normal ejection fraction of 53%  Pt said she now feels "great."  She has had no abd pain while here, so I think she can go home.  Her LFTs are improving.  She knows to return if worse.    Isla Pence, MD 04/19/19 2013

## 2019-04-20 LAB — SARS CORONAVIRUS 2 (TAT 6-24 HRS): SARS Coronavirus 2: NEGATIVE

## 2019-04-21 LAB — CULTURE, BLOOD (ROUTINE X 2)
Culture: NO GROWTH
Culture: NO GROWTH
Special Requests: ADEQUATE
Special Requests: ADEQUATE

## 2020-06-18 IMAGING — NM NM HEPATO W/GB/PHARM/[PERSON_NAME]
3 series · 18 of 18 positions shown · non-contrast
Comparison: Ultrasound 04/19/2019

CLINICAL DATA: Right upper quadrant pain, abnormal ultrasound

EXAM:
NUCLEAR MEDICINE HEPATOBILIARY IMAGING WITH GALLBLADDER EF
TECHNIQUE: Sequential images of the abdomen were obtained [DATE] minutes
following intravenous administration of radiopharmaceutical. After
oral ingestion of Ensure, gallbladder ejection fraction was
determined. At 60 min, normal ejection fraction is greater than 33%.
RADIOPHARMACEUTICALS:  4.9 mCi Nc-GGm  Choletec IV

[Series 1: biliary · 3.25mm/px · 6 of 60 frames shown (1 of 2)]
[frame 6/60]
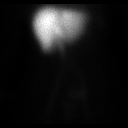
[frame 16/60]
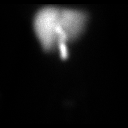
[frame 26/60]
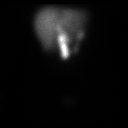
[frame 36/60]
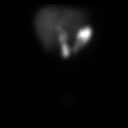
[frame 46/60]
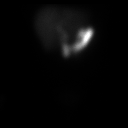
[frame 56/60]
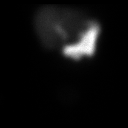

[Series 2: biliary · 3.25mm/px · 6 of 30 frames shown (2 of 2)]
[frame 3/30]
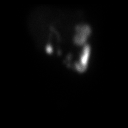
[frame 8/30]
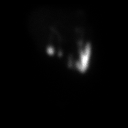
[frame 13/30]
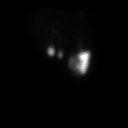
[frame 18/30]
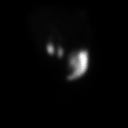
[frame 23/30]
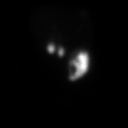
[frame 28/30]
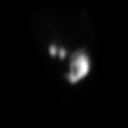

[Series 3: gbef · 3.25mm/px · 6 of 60 frames shown]
[frame 6/60]
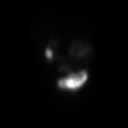
[frame 16/60]
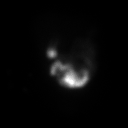
[frame 26/60]
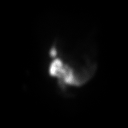
[frame 36/60]
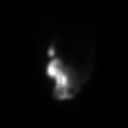
[frame 46/60]
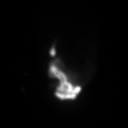
[frame 56/60]
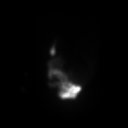

[18 of 18 positions shown; findings below may reference images not displayed]

FINDINGS: Prompt uptake and biliary excretion of activity by the liver is
seen. Faint gallbladder activity visible prior to 60 minutes with
more definite visualization of the gallbladder in the additional 30
minutes of imaging obtained following the first hour period biliary
activity passes into small bowel, consistent with patent common bile
duct.

Calculated gallbladder ejection fraction is 53%. (Normal gallbladder
ejection fraction with Ensure is greater than 33%.)
IMPRESSION: 1. Negative for acute gallbladder disease.
2. Normal ejection fraction of 53%

## 2022-08-30 ENCOUNTER — Other Ambulatory Visit: Payer: Self-pay

## 2022-08-30 ENCOUNTER — Encounter (HOSPITAL_BASED_OUTPATIENT_CLINIC_OR_DEPARTMENT_OTHER): Payer: Self-pay

## 2022-08-30 DIAGNOSIS — Z3A01 Less than 8 weeks gestation of pregnancy: Secondary | ICD-10-CM | POA: Insufficient documentation

## 2022-08-30 DIAGNOSIS — O219 Vomiting of pregnancy, unspecified: Secondary | ICD-10-CM | POA: Insufficient documentation

## 2022-08-30 NOTE — ED Triage Notes (Addendum)
Pt arrives with c/o vomiting for the past 3 days. Pt is currently [redacted] weeks pregnant. Pt tried zofran without relief and also c/o constipation. Pt endorses mild cramping. Pt denies bleeding.

## 2022-08-31 ENCOUNTER — Emergency Department (HOSPITAL_BASED_OUTPATIENT_CLINIC_OR_DEPARTMENT_OTHER)
Admission: EM | Admit: 2022-08-31 | Discharge: 2022-08-31 | Disposition: A | Discharge status: Home / Self Care | Payer: No Typology Code available for payment source | Attending: Emergency Medicine | Admitting: Emergency Medicine

## 2022-08-31 DIAGNOSIS — O210 Mild hyperemesis gravidarum: Secondary | ICD-10-CM

## 2022-08-31 LAB — BASIC METABOLIC PANEL
Anion gap: 9 (ref 5–15)
BUN: 5 mg/dL — ABNORMAL LOW (ref 6–20)
CO2: 24 mmol/L (ref 22–32)
Calcium: 9.4 mg/dL (ref 8.9–10.3)
Chloride: 99 mmol/L (ref 98–111)
Creatinine, Ser: 0.63 mg/dL (ref 0.44–1.00)
GFR, Estimated: >60 mL/min (ref >60)
Glucose, Bld: 99 mg/dL (ref 70–99)
Potassium: 3.5 mmol/L (ref 3.5–5.1)
Sodium: 132 mmol/L — ABNORMAL LOW (ref 135–145)

## 2022-08-31 LAB — CBC WITH DIFFERENTIAL/PLATELET
Abs Immature Granulocytes: 0.03 10*3/uL (ref 0.00–0.07)
Basophils Absolute: 0 10*3/uL (ref 0.0–0.1)
Basophils Relative: 0 %
Eosinophils Absolute: 0.1 10*3/uL (ref 0.0–0.5)
Eosinophils Relative: 1 %
HCT: 37.7 % (ref 36.0–46.0)
Hemoglobin: 12.9 g/dL (ref 12.0–15.0)
Immature Granulocytes: 0 %
Lymphocytes Relative: 28 %
Lymphs Abs: 2.5 10*3/uL (ref 0.7–4.0)
MCH: 29.4 pg (ref 26.0–34.0)
MCHC: 34.2 g/dL (ref 30.0–36.0)
MCV: 85.9 fL (ref 80.0–100.0)
Monocytes Absolute: 1 10*3/uL (ref 0.1–1.0)
Monocytes Relative: 11 %
Neutro Abs: 5.4 10*3/uL (ref 1.7–7.7)
Neutrophils Relative %: 60 %
Platelets: 276 10*3/uL (ref 150–400)
RBC: 4.39 MIL/uL (ref 3.87–5.11)
RDW: 13.2 % (ref 11.5–15.5)
WBC: 9.1 10*3/uL (ref 4.0–10.5)
nRBC: 0 % (ref 0.0–0.2)

## 2022-08-31 LAB — URINALYSIS, ROUTINE W REFLEX MICROSCOPIC
Bilirubin Urine: NEGATIVE
Glucose, UA: NEGATIVE mg/dL
Hgb urine dipstick: NEGATIVE
Ketones, ur: 40 mg/dL — AB
Leukocytes,Ua: NEGATIVE
Nitrite: NEGATIVE
Protein, ur: NEGATIVE mg/dL
Specific Gravity, Urine: 1.01 (ref 1.005–1.030)
pH: 6 (ref 5.0–8.0)

## 2022-08-31 LAB — HCG, QUANTITATIVE, PREGNANCY: hCG, Beta Chain, Quant, S: 61914 m[IU]/mL — ABNORMAL HIGH (ref <5)

## 2022-08-31 MED ORDER — ALUM & MAG HYDROXIDE-SIMETH 200-200-20 MG/5ML PO SUSP
30.0000 mL | Freq: Once | ORAL | Status: AC
  Administered 2022-08-31: 30 mL via ORAL
  Filled 2022-08-31: qty 30

## 2022-08-31 MED ORDER — PRENATAL VITAMIN 27-0.8 MG PO TABS: 1.0000 | ORAL_TABLET | Freq: Every day | ORAL | 0 refills | Status: AC

## 2022-08-31 MED ORDER — ONDANSETRON HCL 4 MG/2ML IJ SOLN
4.0000 mg | Freq: Once | INTRAMUSCULAR | Status: AC
  Administered 2022-08-31: 4 mg via INTRAVENOUS
  Filled 2022-08-31: qty 2

## 2022-08-31 MED ORDER — SODIUM CHLORIDE 0.9 % IV BOLUS
500.0000 mL | Freq: Once | INTRAVENOUS | Status: AC
  Administered 2022-08-31: 500 mL via INTRAVENOUS

## 2022-08-31 NOTE — ED Provider Notes (Signed)
[facility] [place] Provider Note   CSN: [id number] Arrival date & time: 08/30/22  2346     History  Chief Complaint  Patient presents with   Emesis During Pregnancy    Kristen Hensley is a 32 y.o. female.  The history is provided by the patient.  Emesis Duration:  3 days Timing:  Rare (mostly dry heaves but 3 total episodes of emesis.) Quality:  Stomach contents Progression:  Unchanged Chronicity:  New Recent urination:  Normal Context: not post-tussive   Relieved by:  Nothing Worsened by:  Nothing Ineffective treatments:  Antiemetics Associated symptoms: no abdominal pain and no fever   Risk factors: pregnant   Patient with LMP 12/31 and is approximately six weeks pregnant based on LMP presents with dry heaves nad then 3 episodes of emesis.  She has not seem OB at this time.  No pain or bleeding.      Home Medications Prior to Admission medications   Medication Sig Start Date End Date Taking? Authorizing Provider  Prenatal Vit-Fe Fumarate-FA (PRENATAL VITAMIN) 27-0.8 MG TABS Take 1 tablet by mouth daily. 08/31/22  Yes Lilee Aldea, MD  ondansetron (ZOFRAN ODT) 4 MG disintegrating tablet Take 1 tablet (4 mg total) by mouth every 8 (eight) hours as needed. 04/19/19   Isla Pence, MD  SUMAtriptan (IMITREX) 50 MG tablet Take 1 tablet (50 mg total) by mouth once as needed for migraine. May repeat in 2 hours if headache persists or recurs. 04/18/19 04/18/20  Fransico Meadow, PA-C  tobramycin-dexamethasone [facility]) ophthalmic solution Place 2 drops into the right eye every 4 (four) hours while awake. Patient not taking: Reported on 04/16/2019 02/15/15   Tanna Furry, MD      Allergies    Patient has no known allergies.    Review of Systems   Review of Systems  Constitutional:  Negative for fever.  HENT:  Negative for facial swelling.   Eyes:  Negative for redness.  Respiratory:  Negative for wheezing and stridor.    Gastrointestinal:  Positive for nausea and vomiting. Negative for abdominal pain.  Neurological:  Negative for dizziness.  All other systems reviewed and are negative.   Physical Exam Updated Vital Signs BP 104/64 (BP Location: Left Arm)   Pulse 79   Temp 97.8 F (36.6 C)   Resp 20   Ht 5' 2"$  (1.575 m)   Wt 51.7 kg   LMP 07/17/2022   SpO2 99%   BMI 20.85 kg/m  Physical Exam Vitals and nursing note reviewed.  Constitutional:      General: She is not in acute distress.    Appearance: Normal appearance. She is well-developed.  HENT:     Head: Normocephalic and atraumatic.     Nose: Nose normal.  Eyes:     Pupils: Pupils are equal, round, and reactive to light.  Cardiovascular:     Rate and Rhythm: Normal rate and regular rhythm.     Pulses: Normal pulses.     Heart sounds: Normal heart sounds.  Pulmonary:     Effort: Pulmonary effort is normal. No respiratory distress.     Breath sounds: Normal breath sounds.  Abdominal:     General: Bowel sounds are normal. There is no distension.     Palpations: Abdomen is soft.     Tenderness: There is no abdominal tenderness. There is no guarding or rebound.  Genitourinary:    Vagina: No vaginal discharge.  Musculoskeletal:  General: Normal range of motion.     Cervical back: Normal range of motion and neck supple.  Skin:    General: Skin is warm and dry.     Capillary Refill: Capillary refill takes less than 2 seconds.     Findings: No erythema or rash.  Neurological:     General: No focal deficit present.     Mental Status: She is alert.     Deep Tendon Reflexes: Reflexes normal.  Psychiatric:        Mood and Affect: Mood normal.     ED Results / Procedures / Treatments   Labs (all labs ordered are listed, but only abnormal results are displayed) Results for orders placed or performed during the hospital encounter of 08/31/22  Urinalysis, Routine w reflex microscopic -Urine, Clean Catch  Result Value Ref Range    Color, Urine YELLOW YELLOW   APPearance CLEAR CLEAR   Specific Gravity, Urine 1.010 1.005 - 1.030   pH 6.0 5.0 - 8.0   Glucose, UA NEGATIVE NEGATIVE mg/dL   Hgb urine dipstick NEGATIVE NEGATIVE   Bilirubin Urine NEGATIVE NEGATIVE   Ketones, ur 40 (A) NEGATIVE mg/dL   Protein, ur NEGATIVE NEGATIVE mg/dL   Nitrite NEGATIVE NEGATIVE   Leukocytes,Ua NEGATIVE NEGATIVE  CBC with Differential  Result Value Ref Range   WBC 9.1 4.0 - 10.5 K/uL   RBC 4.39 3.87 - 5.11 MIL/uL   Hemoglobin 12.9 12.0 - 15.0 g/dL   HCT 37.7 36.0 - 46.0 %   MCV 85.9 80.0 - 100.0 fL   MCH 29.4 26.0 - 34.0 pg   MCHC 34.2 30.0 - 36.0 g/dL   RDW 13.2 11.5 - 15.5 %   Platelets 276 150 - 400 K/uL   nRBC 0.0 0.0 - 0.2 %   Neutrophils Relative % 60 %   Neutro Abs 5.4 1.7 - 7.7 K/uL   Lymphocytes Relative 28 %   Lymphs Abs 2.5 0.7 - 4.0 K/uL   Monocytes Relative 11 %   Monocytes Absolute 1.0 0.1 - 1.0 K/uL   Eosinophils Relative 1 %   Eosinophils Absolute 0.1 0.0 - 0.5 K/uL   Basophils Relative 0 %   Basophils Absolute 0.0 0.0 - 0.1 K/uL   Immature Granulocytes 0 %   Abs Immature Granulocytes 0.03 0.00 - 0.07 K/uL  Basic metabolic panel  Result Value Ref Range   Sodium 132 (L) 135 - 145 mmol/L   Potassium 3.5 3.5 - 5.1 mmol/L   Chloride 99 98 - 111 mmol/L   CO2 24 22 - 32 mmol/L   Glucose, Bld 99 70 - 99 mg/dL   BUN 5 (L) 6 - 20 mg/dL   Creatinine, Ser 0.63 0.44 - 1.00 mg/dL   Calcium 9.4 8.9 - 10.3 mg/dL   GFR, Estimated >60 >60 mL/min   Anion gap 9 5 - 15  hCG, quantitative, pregnancy  Result Value Ref Range   hCG, Beta Chain, Quant, S 61,914 (H) <5 mIU/mL   No results found.\132  Radiology No results found.  Procedures Procedures    Medications Ordered in ED Medications  sodium chloride 0.9 % bolus 500 mL (0 mLs Intravenous Stopped 08/31/22 0403)  ondansetron (ZOFRAN) injection 4 mg (4 mg Intravenous Given 08/31/22 0319)  alum & mag hydroxide-simeth (MAALOX/MYLANTA) 200-200-20 MG/5ML  suspension 30 mL (30 mLs Oral Given 08/31/22 0403)    ED Course/ Medical Decision Making/ A&P  Medical Decision Making Patient who is approximately six weeks pregnant presents with dry heaves and 3 episodes of emesis   Amount and/or Complexity of Data Reviewed External Data Reviewed: notes.    Details: Previous notes reviewed  Labs: ordered.    Details: All labs reviewed:  HCG 61, 914.  Sodium 132, potassium 3.5 normal creatinine .63. Normal white count 9.1, normal hemoglobin 12.9 normal platelets urine without UTI  Risk OTC drugs. Prescription drug management. Risk Details: Morning sickness.  IV fluids and zofran given without further emesis.  Well appearing.  Stable for discharge with close follow up.  Strict return     Final Clinical Impression(s) / ED Diagnoses Final diagnoses:  Morning sickness   Return for intractable cough, coughing up blood, fevers > 100.4 unrelieved by medication, shortness of breath, intractable vomiting, chest pain, shortness of breath, weakness, numbness, changes in speech, facial asymmetry, abdominal pain, passing out, Inability to tolerate liquids or food, cough, altered mental status or any concerns. No signs of systemic illness or infection. The patient is nontoxic-appearing on exam and vital signs are within normal limits.  I have reviewed the triage vital signs and the nursing notes. Pertinent labs & imaging results that were available during my care of the patient were reviewed by me and considered in my medical decision making (see chart for details). After history, exam, and medical workup I feel the patient has been appropriately medically screened and is safe for discharge home. Pertinent diagnoses were discussed with the patient. Patient was given return precautions. Rx / DC Orders ED Discharge Orders          Ordered    Prenatal Vit-Fe Fumarate-FA (PRENATAL VITAMIN) 27-0.8 MG TABS  Daily        08/31/22 0433               Jazzmine Kleiman, MD 08/31/22 [id number]

## 2022-08-31 NOTE — ED Notes (Signed)
Pt able to tolerate water and crackers. Reports relief of heartburn with maalox

## 2022-10-16 ENCOUNTER — Emergency Department (HOSPITAL_BASED_OUTPATIENT_CLINIC_OR_DEPARTMENT_OTHER): Payer: Medicaid Other

## 2022-10-16 ENCOUNTER — Encounter (HOSPITAL_BASED_OUTPATIENT_CLINIC_OR_DEPARTMENT_OTHER): Payer: Self-pay

## 2022-10-16 ENCOUNTER — Emergency Department (HOSPITAL_BASED_OUTPATIENT_CLINIC_OR_DEPARTMENT_OTHER)
Admission: EM | Admit: 2022-10-16 | Discharge: 2022-10-16 | Disposition: A | Discharge status: Home / Self Care | Payer: Medicaid Other | Attending: Emergency Medicine | Admitting: Emergency Medicine

## 2022-10-16 ENCOUNTER — Other Ambulatory Visit: Payer: Self-pay

## 2022-10-16 DIAGNOSIS — O209 Hemorrhage in early pregnancy, unspecified: Secondary | ICD-10-CM | POA: Diagnosis not present

## 2022-10-16 DIAGNOSIS — Z3A13 13 weeks gestation of pregnancy: Secondary | ICD-10-CM | POA: Diagnosis not present

## 2022-10-16 DIAGNOSIS — O4690 Antepartum hemorrhage, unspecified, unspecified trimester: Secondary | ICD-10-CM

## 2022-10-16 LAB — URINALYSIS, ROUTINE W REFLEX MICROSCOPIC
Bilirubin Urine: NEGATIVE
Glucose, UA: NEGATIVE mg/dL
Hgb urine dipstick: NEGATIVE
Ketones, ur: NEGATIVE mg/dL
Leukocytes,Ua: NEGATIVE
Nitrite: NEGATIVE
Protein, ur: NEGATIVE mg/dL
Specific Gravity, Urine: 1.025 (ref 1.005–1.030)
pH: 7 (ref 5.0–8.0)

## 2022-10-16 LAB — WET PREP, GENITAL
Clue Cells Wet Prep HPF POC: NONE SEEN
Sperm: NONE SEEN
Trich, Wet Prep: NONE SEEN
WBC, Wet Prep HPF POC: >=10 — AB (ref <10)
Yeast Wet Prep HPF POC: NONE SEEN

## 2022-10-16 LAB — CBC WITH DIFFERENTIAL/PLATELET
Abs Immature Granulocytes: 0.05 10*3/uL (ref 0.00–0.07)
Basophils Absolute: 0 10*3/uL (ref 0.0–0.1)
Basophils Relative: 0 %
Eosinophils Absolute: 0.1 10*3/uL (ref 0.0–0.5)
Eosinophils Relative: 1 %
HCT: 35.3 % — ABNORMAL LOW (ref 36.0–46.0)
Hemoglobin: 12.1 g/dL (ref 12.0–15.0)
Immature Granulocytes: 1 %
Lymphocytes Relative: 27 %
Lymphs Abs: 2.3 10*3/uL (ref 0.7–4.0)
MCH: 29.6 pg (ref 26.0–34.0)
MCHC: 34.3 g/dL (ref 30.0–36.0)
MCV: 86.3 fL (ref 80.0–100.0)
Monocytes Absolute: 0.6 10*3/uL (ref 0.1–1.0)
Monocytes Relative: 7 %
Neutro Abs: 5.7 10*3/uL (ref 1.7–7.7)
Neutrophils Relative %: 64 %
Platelets: 247 10*3/uL (ref 150–400)
RBC: 4.09 MIL/uL (ref 3.87–5.11)
RDW: 13.2 % (ref 11.5–15.5)
WBC: 8.8 10*3/uL (ref 4.0–10.5)
nRBC: 0 % (ref 0.0–0.2)

## 2022-10-16 LAB — BASIC METABOLIC PANEL
Anion gap: 10 (ref 5–15)
BUN: <5 mg/dL — ABNORMAL LOW (ref 6–20)
CO2: 20 mmol/L — ABNORMAL LOW (ref 22–32)
Calcium: 8.7 mg/dL — ABNORMAL LOW (ref 8.9–10.3)
Chloride: 104 mmol/L (ref 98–111)
Creatinine, Ser: 0.58 mg/dL (ref 0.44–1.00)
GFR, Estimated: >60 mL/min (ref >60)
Glucose, Bld: 92 mg/dL (ref 70–99)
Potassium: 3.8 mmol/L (ref 3.5–5.1)
Sodium: 134 mmol/L — ABNORMAL LOW (ref 135–145)

## 2022-10-16 LAB — ABO/RH: ABO/RH(D): AB POS

## 2022-10-16 LAB — HCG, QUANTITATIVE, PREGNANCY: hCG, Beta Chain, Quant, S: 108370 m[IU]/mL — ABNORMAL HIGH (ref <5)

## 2022-10-16 NOTE — ED Triage Notes (Signed)
Pt reports she is [redacted] weeks pregnant. Bright red blood last night subsided, but when she woke up blood pink. Denies any  abdominal pain

## 2022-10-16 NOTE — ED Provider Notes (Signed)
[place] [facility] [place] Provider Note   CSN: [id number] Arrival date & time: 10/16/22  1049     History  Chief Complaint  Patient presents with   Vaginal Bleeding    Kristen Hensley is a 32 y.o. female.  Patient reports she is [redacted] weeks pregnant G1, P0.  Has had ultrasound with this pregnancy.  Reports intermittent vaginal bleeding since last night.  Did have enough to use a panty liner last night which seems to have subsided but now still having some pink discharge.  Denies any cramps or abdominal pain.  No gush of fluid.  No fevers, chills, nausea or vomiting.  No pain with urination or blood in the urine.  No dizziness or lightheadedness.  Still has appendix and gallbladder.  Last sexual activity 3 days ago.  The history is provided by the patient.  Vaginal Bleeding Associated symptoms: no abdominal pain, no dizziness, no dysuria, no fever and no nausea        Home Medications Prior to Admission medications   Medication Sig Start Date End Date Taking? Authorizing Provider  Prenatal Vit-Fe Fumarate-FA (PRENATAL VITAMIN) 27-0.8 MG TABS Take 1 tablet by mouth daily. 08/31/22  Yes Palumbo, April, MD  ondansetron (ZOFRAN ODT) 4 MG disintegrating tablet Take 1 tablet (4 mg total) by mouth every 8 (eight) hours as needed. 04/19/19   Isla Pence, MD  SUMAtriptan (IMITREX) 50 MG tablet Take 1 tablet (50 mg total) by mouth once as needed for migraine. May repeat in 2 hours if headache persists or recurs. 04/18/19 04/18/20  Fransico Meadow, PA-C  tobramycin-dexamethasone [facility]) ophthalmic solution Place 2 drops into the right eye every 4 (four) hours while awake. Patient not taking: Reported on 04/16/2019 02/15/15   Tanna Furry, MD      Allergies    Patient has no known allergies.    Review of Systems   Review of Systems  Constitutional:  Negative for activity change, appetite change and fever.  HENT:  Negative for congestion.   Respiratory:   Negative for cough, chest tightness and shortness of breath.   Cardiovascular:  Negative for chest pain.  Gastrointestinal:  Negative for abdominal pain, nausea and vomiting.  Genitourinary:  Positive for vaginal bleeding. Negative for dysuria and hematuria.  Musculoskeletal:  Negative for arthralgias and myalgias.  Skin:  Negative for rash.  Neurological:  Negative for dizziness, weakness, light-headedness and headaches.   all other systems are negative except as noted in the HPI and PMH.    Physical Exam Updated Vital Signs BP 114/73   Pulse 75   Temp 98.5 F (36.9 C) (Oral)   Resp 18   LMP 07/17/2022   SpO2 99%  Physical Exam Vitals and nursing note reviewed.  Constitutional:      General: She is not in acute distress.    Appearance: She is well-developed.  HENT:     Head: Normocephalic and atraumatic.     Mouth/Throat:     Pharynx: No oropharyngeal exudate.  Eyes:     Conjunctiva/sclera: Conjunctivae normal.     Pupils: Pupils are equal, round, and reactive to light.  Neck:     Comments: No meningismus. Cardiovascular:     Rate and Rhythm: Normal rate and regular rhythm.     Heart sounds: Normal heart sounds. No murmur heard. Pulmonary:     Effort: Pulmonary effort is normal. No respiratory distress.     Breath sounds: Normal breath sounds.  Chest:  Chest wall: No tenderness.  Abdominal:     Palpations: Abdomen is soft.     Tenderness: There is no abdominal tenderness. There is no guarding or rebound.  Genitourinary:    Comments: Chaperone present Nurse, adult.  Normal external genitalia.  White discharge in vaginal vault with some pink discoloration.  Cervix is closed.  No CMT or lateralizing adnexal tenderness Musculoskeletal:        General: No tenderness. Normal range of motion.     Cervical back: Normal range of motion and neck supple.  Skin:    General: Skin is warm.  Neurological:     Mental Status: She is alert and oriented to person, place, and time.      Cranial Nerves: No cranial nerve deficit.     Motor: No abnormal muscle tone.     Coordination: Coordination normal.     Comments:  5/5 strength throughout. CN 2-12 intact.Equal grip strength.   Psychiatric:        Behavior: Behavior normal.     ED Results / Procedures / Treatments   Labs (all labs ordered are listed, but only abnormal results are displayed) Labs Reviewed  WET PREP, GENITAL - Abnormal; Notable for the following components:      Result Value   WBC, Wet Prep HPF POC >=10 (*)    All other components within normal limits  CBC WITH DIFFERENTIAL/PLATELET - Abnormal; Notable for the following components:   HCT 35.3 (*)    All other components within normal limits  BASIC METABOLIC PANEL - Abnormal; Notable for the following components:   Sodium 134 (*)    CO2 20 (*)    BUN <5 (*)    Calcium 8.7 (*)    All other components within normal limits  HCG, QUANTITATIVE, PREGNANCY - Abnormal; Notable for the following components:   hCG, Beta Chain, Quant, S 108,370 (*)    All other components within normal limits  URINALYSIS, ROUTINE W REFLEX MICROSCOPIC - Abnormal; Notable for the following components:   APPearance HAZY (*)    All other components within normal limits  ABO/RH  GC/CHLAMYDIA PROBE AMP ([place]) NOT AT [facility]    EKG None  Radiology US OB Comp Less 14 Wks  Result Date: 10/16/2022 CLINICAL DATA:  Vaginal bleeding EXAM: OBSTETRIC <14 WK ULTRASOUND TECHNIQUE: Transabdominal ultrasound was performed for evaluation of the gestation as well as the maternal uterus and adnexal regions. COMPARISON:  None Available. FINDINGS: Intrauterine gestational sac: Single Yolk sac:  Not Visualized. Embryo:  Visualized. Cardiac Activity: Visualized. Heart Rate: 162 bpm MSD:  63.3 mm   12 w   4 d CRL:   73.4 mm   13 w 3 d                  [place] EDC: 04/20/2023 Subchorionic hemorrhage: Moderate subchorionic hemorrhage inferior to the gestational sac measuring 5.0 x 1.2 x 4.6 cm.  Maternal uterus/adnexae: Unremarkable. IMPRESSION: 1. Single intrauterine gestation at sonographic gestational age of [redacted] weeks, 3 days. EDD 04/20/2023. Fetal heart rate 162 beats per minute. 2.  Moderate subchorionic hemorrhage. Electronically Signed   By: Delanna Ahmadi M.D.   On: 10/16/2022 12:10    Procedures Procedures    Medications Ordered in ED Medications - No data to display  ED Course/ Medical Decision Making/ A&P  Medical Decision Making Amount and/or Complexity of Data Reviewed Labs: ordered. Decision-making details documented in ED Course. Radiology: ordered and independent interpretation performed. Decision-making details documented in ED Course. ECG/medicine tests: ordered and independent interpretation performed. Decision-making details documented in ED Course.  Vaginal bleeding in pregnancy.  Vital signs are stable, no distress.  Ultrasound March 21 did show confirmed IUP at outside facility.  Pelvic exam benign as above.  Hemoglobin is stable. Ultrasound shows IUP with fetal heart rate 162.  Moderate subchorionic hemorrhage.  hCG quant is 108,000.  Rh is positive, not RhoGAM candidate.  Urinalysis is negative.  Patient has OB follow-up in 2 days.  Instructed that 50% of pregnancies do have bleeding and cannot predict the success of this pregnancy.  Follow-up with her OB doctor as scheduled.  Return to the ED with worsening pain, bleeding, dizziness or other concerns.        Final Clinical Impression(s) / ED Diagnoses Final diagnoses:  Vaginal bleeding in pregnancy    Rx / DC Orders ED Discharge Orders     None         Lorelee Mclaurin, Annie Main, MD 10/16/22 1443

## 2022-10-16 NOTE — Discharge Instructions (Signed)
Ultrasound shows baby in the correct location.  There are some subchorionic hemorrhage.  This could be seen in the setting of normal pregnancy but needs to be followed by her OB doctor.  Follow-up as scheduled in 2 days.  Return to the ED with worsening pain, bleeding, nausea, vomiting or dizziness or other concerns.

## 2022-10-17 LAB — GC/CHLAMYDIA PROBE AMP (~~LOC~~) NOT AT ARMC
Chlamydia: NEGATIVE
Comment: NEGATIVE
Comment: NORMAL
Neisseria Gonorrhea: NEGATIVE

## 2023-12-29 ENCOUNTER — Ambulatory Visit (INDEPENDENT_AMBULATORY_CARE_PROVIDER_SITE_OTHER)

## 2023-12-29 ENCOUNTER — Ambulatory Visit
Admission: EM | Admit: 2023-12-29 | Discharge: 2023-12-29 | Disposition: A | Discharge status: Home / Self Care | Attending: Family Medicine | Admitting: Family Medicine

## 2023-12-29 DIAGNOSIS — Z3A01 Less than 8 weeks gestation of pregnancy: Secondary | ICD-10-CM | POA: Diagnosis not present

## 2023-12-29 DIAGNOSIS — M79672 Pain in left foot: Secondary | ICD-10-CM | POA: Diagnosis not present

## 2023-12-29 LAB — POCT URINE PREGNANCY: Preg Test, Ur: POSITIVE — AB

## 2023-12-29 NOTE — ED Triage Notes (Addendum)
 Pt present with c/o lt foot pain x 1 month. Pt states the pain started at the lt knee and then to the ball of the foot. Pt states it hurts to touch it and to stand.   Pt states she is pregnant and does not know how far long she is.

## 2023-12-29 NOTE — Discharge Instructions (Signed)
 May use a postop shoe to help support the tendon.  He may elevate and ice as needed.  Take over-the-counter Tylenol  as needed for your pain.  Please follow-up with podiatry for further workup and treatment of your symptoms.  Please go to the ER for any worsening symptoms.  Hope you feel better soon!

## 2023-12-29 NOTE — ED Provider Notes (Signed)
 [facility]    CSN: [id number] Arrival date & time: 12/29/23  1884      History   Chief Complaint Chief Complaint  Patient presents with   Foot Pain    HPI Kristen Hensley is a 33 y.o. female presents for foot pain.  Patient reports 1 month initially intermittent left foot pain that over the past 2 to 3 days has worsened and been more persistent.  She reports pain is to be plantar aspect of the left first MTP joint.  Endorses swelling and burning sensation but denies numbness tingling, erythema, warmth, or bruising.  States pain began a month ago after she had hurt her knee and was placing a lot of her weight onto the ball of her foot.  Her knee pain has since subsided but she continues to have pain to the foot.  Denies history of gout or surgeries to the foot in the past.  Patient is breast-feeding and is also pregnant estimating that she is anywhere from 4 to 6 weeks.  She has her first OB appointment in July.  She tried topical Voltaren gel which she found that she was pregnant she stopped.  No other concerns at this time.   Foot Pain    Past Medical History:  Diagnosis Date   Chlamydia    Gonorrhea    History of acute PID    Migraine    STD (female)     Patient Active Problem List   Diagnosis Date Noted   Sepsis (HCC) 04/16/2019    Past Surgical History:  Procedure Laterality Date   BREAST ENHANCEMENT SURGERY      OB History     Gravida  1   Para      Term      Preterm      AB      Living         SAB      IAB      Ectopic      Multiple      Live Births               Home Medications    Prior to Admission medications   Medication Sig Start Date End Date Taking? Authorizing Provider  ondansetron  (ZOFRAN  ODT) 4 MG disintegrating tablet Take 1 tablet (4 mg total) by mouth every 8 (eight) hours as needed. 04/19/19   Sueellen Emery, MD  Prenatal Vit-Fe Fumarate-FA (PRENATAL VITAMIN) 27-0.8 MG TABS Take 1 tablet by mouth daily.  08/31/22   Palumbo, April, MD  SUMAtriptan  (IMITREX ) 50 MG tablet Take 1 tablet (50 mg total) by mouth once as needed for migraine. May repeat in 2 hours if headache persists or recurs. 04/18/19 04/18/20  Sofia, Leslie K, PA-C  tobramycin -dexamethasone  (TOBRADEX ) ophthalmic solution Place 2 drops into the right eye every 4 (four) hours while awake. Patient not taking: Reported on 04/16/2019 02/15/15   Eino Gravel, MD    Family History Family History  Problem Relation Age of Onset   Diabetes Other    Hypertension Other    Stroke Other    Cancer Other     Social History Social History   Tobacco Use   Smoking status: Former    Types: Cigars   Smokeless tobacco: Never  Vaping Use   Vaping status: Never Used  Substance Use Topics   Alcohol use: Yes    Comment: weekly   Drug use: No     Allergies   Patient has no  known allergies.   Review of Systems Review of Systems  Musculoskeletal:        Left foot pain      Physical Exam Triage Vital Signs ED Triage Vitals  Encounter Vitals Group     BP 12/29/23 0901 94/63     Girls Systolic BP Percentile --      Girls Diastolic BP Percentile --      Boys Systolic BP Percentile --      Boys Diastolic BP Percentile --      Pulse Rate 12/29/23 0859 86     Resp 12/29/23 0859 17     Temp 12/29/23 0859 98.5 F (36.9 C)     Temp Source 12/29/23 0859 Oral     SpO2 12/29/23 0859 97 %     Weight --      Height --      Head Circumference --      Peak Flow --      Pain Score 12/29/23 0857 7     Pain Loc --      Pain Education --      Exclude from Growth Chart --    No data found.  Updated Vital Signs BP 94/63   Pulse 86   Temp 98.5 F (36.9 C) (Oral)   Resp 17   LMP  (Exact Date)   SpO2 97%   Breastfeeding Yes   Visual Acuity Right Eye Distance:   Left Eye Distance:   Bilateral Distance:    Right Eye Near:   Left Eye Near:    Bilateral Near:     Physical Exam Vitals and nursing note reviewed.  Constitutional:       General: She is not in acute distress.    Appearance: Normal appearance. She is not ill-appearing.  HENT:     Head: Normocephalic and atraumatic.   Eyes:     Pupils: Pupils are equal, round, and reactive to light.    Cardiovascular:     Rate and Rhythm: Normal rate.  Pulmonary:     Effort: Pulmonary effort is normal.   Musculoskeletal:       Feet:  Feet:     Comments: There is no swelling, ecchymosis, erythema warmth of the left MTP joint.  It is tender to palpation to the plantar aspect of the foot overlying the first metatarsal.  There is no deformity.  No tenderness with palpation to the dorsum of the foot or to the great toe.  DP +2.  Skin:    General: Skin is warm and dry.   Neurological:     General: No focal deficit present.     Mental Status: She is alert and oriented to person, place, and time.   Psychiatric:        Mood and Affect: Mood normal.        Behavior: Behavior normal.      UC Treatments / Results  Labs (all labs ordered are listed, but only abnormal results are displayed) Labs Reviewed  POCT URINE PREGNANCY - Abnormal; Notable for the following components:      Result Value   Preg Test, Ur Positive (*)    All other components within normal limits    EKG   Radiology DG Foot Complete Left Result Date: 12/29/2023 EXAM: 3 VIEW(S) XRAY OF THE LEFT FOOT 12/29/2023 09:42:11 AM COMPARISON: None available. CLINICAL HISTORY: Pain to bottom of 1st MTP joint x 1 month. Patient properly shielded for exam. FINDINGS: BONES AND JOINTS: No acute  fracture. No focal osseous lesion. No joint dislocation. Intact first metatarsal sesamoids. SOFT TISSUES: The soft tissues are unremarkable. IMPRESSION: 1. No acute findings. Electronically signed by: Karlyn Overman MD 12/29/2023 10:05 AM EDT RP Workstation: [id number]    Procedures Procedures (including critical care time)  Medications Ordered in UC Medications - No data to display  Initial Impression / Assessment  and Plan / UC Course  I have reviewed the triage vital signs and the nursing notes.  Pertinent labs & imaging results that were available during my care of the patient were reviewed by me and considered in my medical decision making (see chart for details).     Reviewed exam and symptoms with patient.  No red flags.  X-ray negative.  Discussed possible strain of MTP joint versus possible tendinitis.  Given patient has both pregnant and breast-feeding this does significantly reduce treatment options.  She was fitted with postop shoe to help better support the area.  Discussed RICE therapy and recommended OTC Tylenol  only at this time for pain until she is able to follow-up with her OB.  Will also have her follow-up with podiatry for further treatment options.  ER precautions reviewed. Final Clinical Impressions(s) / UC Diagnoses   Final diagnoses:  Left foot pain  Less than [redacted] weeks gestation of pregnancy     Discharge Instructions      May use a postop shoe to help support the tendon.  He may elevate and ice as needed.  Take over-the-counter Tylenol  as needed for your pain.  Please follow-up with podiatry for further workup and treatment of your symptoms.  Please go to the ER for any worsening symptoms.  Hope you feel better soon!     ED Prescriptions   None    PDMP not reviewed this encounter.   Alleen Arbour, NP 12/29/23 1014
